# Patient Record
Sex: Female | Born: 1975
Health system: Southern US, Community
[De-identification: ages and names within clinical notes are randomized; demographics above are authoritative.]

## PROBLEM LIST (undated history)

## (undated) DIAGNOSIS — T7840XA Allergy, unspecified, initial encounter: Secondary | ICD-10-CM

## (undated) DIAGNOSIS — F329 Major depressive disorder, single episode, unspecified: Secondary | ICD-10-CM

## (undated) DIAGNOSIS — M5136 Other intervertebral disc degeneration, lumbar region: Secondary | ICD-10-CM

## (undated) DIAGNOSIS — R112 Nausea with vomiting, unspecified: Secondary | ICD-10-CM

## (undated) DIAGNOSIS — Z8614 Personal history of Methicillin resistant Staphylococcus aureus infection: Secondary | ICD-10-CM

## (undated) DIAGNOSIS — T8859XA Other complications of anesthesia, initial encounter: Secondary | ICD-10-CM

## (undated) DIAGNOSIS — F419 Anxiety disorder, unspecified: Secondary | ICD-10-CM

## (undated) DIAGNOSIS — C07 Malignant neoplasm of parotid gland: Secondary | ICD-10-CM

## (undated) DIAGNOSIS — T4145XA Adverse effect of unspecified anesthetic, initial encounter: Secondary | ICD-10-CM

## (undated) DIAGNOSIS — E785 Hyperlipidemia, unspecified: Secondary | ICD-10-CM

## (undated) DIAGNOSIS — F32A Depression, unspecified: Secondary | ICD-10-CM

## (undated) DIAGNOSIS — M5126 Other intervertebral disc displacement, lumbar region: Secondary | ICD-10-CM

## (undated) DIAGNOSIS — I10 Essential (primary) hypertension: Secondary | ICD-10-CM

## (undated) DIAGNOSIS — Z9889 Other specified postprocedural states: Secondary | ICD-10-CM

## (undated) DIAGNOSIS — M51369 Other intervertebral disc degeneration, lumbar region without mention of lumbar back pain or lower extremity pain: Secondary | ICD-10-CM

## (undated) HISTORY — PX: TUBAL LIGATION: SHX77

## (undated) HISTORY — DX: Allergy, unspecified, initial encounter: T78.40XA

## (undated) HISTORY — PX: SPINE SURGERY: SHX786

## (undated) HISTORY — DX: Anxiety disorder, unspecified: F41.9

## (undated) HISTORY — DX: Hyperlipidemia, unspecified: E78.5

---

## 2008-07-17 ENCOUNTER — Emergency Department (HOSPITAL_BASED_OUTPATIENT_CLINIC_OR_DEPARTMENT_OTHER): Admission: EM | Admit: 2008-07-17 | Discharge: 2008-07-17 | Payer: Self-pay | Admitting: Emergency Medicine

## 2012-06-30 HISTORY — PX: MICRODISCECTOMY LUMBAR: SUR864

## 2013-04-12 ENCOUNTER — Other Ambulatory Visit (HOSPITAL_COMMUNITY)
Admission: RE | Admit: 2013-04-12 | Discharge: 2013-04-12 | Disposition: A | Discharge status: Home / Self Care | Payer: BC Managed Care – PPO | Source: Ambulatory Visit | Attending: Otolaryngology | Admitting: Otolaryngology

## 2013-04-12 DIAGNOSIS — R599 Enlarged lymph nodes, unspecified: Secondary | ICD-10-CM | POA: Insufficient documentation

## 2013-04-30 DIAGNOSIS — C07 Malignant neoplasm of parotid gland: Secondary | ICD-10-CM

## 2013-04-30 HISTORY — DX: Malignant neoplasm of parotid gland: C07

## 2013-05-02 ENCOUNTER — Encounter (HOSPITAL_BASED_OUTPATIENT_CLINIC_OR_DEPARTMENT_OTHER): Payer: Self-pay | Admitting: *Deleted

## 2013-05-02 NOTE — Pre-Procedure Instructions (Signed)
To come for BMET; EKG req. from [facility]

## 2013-05-03 ENCOUNTER — Encounter (HOSPITAL_BASED_OUTPATIENT_CLINIC_OR_DEPARTMENT_OTHER)
Admission: RE | Admit: 2013-05-03 | Discharge: 2013-05-03 | Disposition: A | Discharge status: Home / Self Care | Payer: BC Managed Care – PPO | Source: Ambulatory Visit | Attending: Otolaryngology | Admitting: Otolaryngology

## 2013-05-03 DIAGNOSIS — Z01812 Encounter for preprocedural laboratory examination: Secondary | ICD-10-CM | POA: Insufficient documentation

## 2013-05-03 DIAGNOSIS — Z01818 Encounter for other preprocedural examination: Secondary | ICD-10-CM | POA: Insufficient documentation

## 2013-05-03 LAB — BASIC METABOLIC PANEL
Calcium: 9.2 mg/dL (ref 8.4–10.5)
Chloride: 103 mEq/L (ref 96–112)
GFR calc Af Amer: >90 mL/min (ref >90)
GFR calc non Af Amer: >90 mL/min (ref >90)
Glucose, Bld: 79 mg/dL (ref 70–99)
Sodium: 138 mEq/L (ref 135–145)

## 2013-05-06 ENCOUNTER — Other Ambulatory Visit: Payer: Self-pay | Admitting: Otolaryngology

## 2013-05-06 NOTE — H&P (Signed)
[name],  [name] 37 y.o., female [id number]     Chief Complaint: LEft neck mass  HPI: 36-year-old white female noted a lump in her LEFT upper neck maybe one month ago.  It is not tender.  It is not obviously growing.  It does not swell with meals.  No overlying skin changes.  No trismus.  No scalp or face rashes or lesions.  No abnormal or insect bites or scratches.  No other masses elsewhere in her neck or elsewhere in her body.  Her family physician ordered an ultrasound which showed a 1.9 cm mass in the LEFT parotid.  An MRI scan showed the same.  She is here for further evaluation.  Preoperative visit.  We are planning a LEFT superficial parotidectomy with facial nerve preservation.  I discussed the surgeryin detail including risks and complications.  Questions were answered and informed consent was obtained.   I discussed advancement of diet and activity including return to work.  I gave her prescriptions for oxycodone and hydrocodone for postoperative pain relief.  I will call her with the permanent pathology report which will take 3-4 days.  I will see her back 10 days after surgery for suture removal.  PMH: Past Medical History  Diagnosis Date  . Depression   . Hypertension     under control with med., has been on med. x 5 yr.  . 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 History of MRSA infection "years ago"    axilla    Surg Hx: Past Surgical History  Procedure Laterality Date  . Microdiscectomy lumbar  06/2012    L5  . Cesarean section      FHx:  No family history on file. SocHx:  reports that she has quit smoking. She has never used smokeless tobacco. She reports that she does not drink alcohol or use illicit drugs.  ALLERGIES:  Allergies  Allergen Reactions  . Keflex [Cephalexin] Rash  . Sulfa Antibiotics Rash     (Not in a hospital admission)  No results found for this or any previous visit (from the past 48 hour(s)). No results found.  ROS:Systemic: Not feeling  tired (fatigue).  No fever, no night sweats, and no recent weight loss. Head: No headache. Eyes: No eye symptoms. Otolaryngeal: No hearing loss, no earache, no tinnitus, and no purulent nasal discharge.  No nasal passage blockage (stuffiness), no snoring, no sneezing, no hoarseness, and no sore throat. Cardiovascular: No chest pain or discomfort  and no palpitations. Pulmonary: No dyspnea, no cough, and no wheezing. Gastrointestinal: No dysphagia  and no heartburn.  No nausea, no abdominal pain, and no melena.  No diarrhea. Genitourinary: No dysuria. Endocrine: No muscle weakness. Musculoskeletal: No calf muscle cramps, no arthralgias, and no soft tissue swelling. Neurological: No dizziness, no fainting, no tingling, and no numbness. Psychological: Anxiety  and depression. Skin: No rash.  Last menstrual period 04/26/2013.  PHYSICAL EXAM:  BP:128/79,  HR: 103 b/min,  Weight: 225 lb,  BMI Calculated: 36.32 ,   She is slightly heavyset.  She is not distress.  Mental status is sharp.  She hears well in conversational speech.  Voice is clear and respirations unlabored through the nose.  The head is atraumatic and neck supple.  Cranial nerves intact including facial.  Ear canals are clear with normal drums.  Anterior nose is moist and patent.  Oral cavity is clear and moist with teeth in good repair.  Oropharynx is clear.  I did not examine   nasopharynx or hypopharynx.  Neck examination is remarkable for a subtle roughly 2 cm mass behind the angle of the mandible tucked in in front of the mastoid tip.  It is somewhat mobile.  It is not fluctuant.  It is not hard or tender.  No other neck masses or adenopathy.   Facial nerve is intact both sides.  Lungs: Clear to auscultation Heart: Regular rate and rhythm without murmur Abdomen: Soft, active Extremities: Normal configuration Neurologic: Symmetric, grossly intact.  Studies Reviewed:her fine needle aspiration cytology results show mostly  benign salivary cells.  There were a few atypical epitheliod cells  of uncertain diagnostic significance, but not obviously cancer.      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  We are going to do a LEFT superficial parotidectomy for removal of a presumed benign neoplasm.  You should be out of strenuous activities 3 weeks.  Recheck here 10 days after surgery.  I will call you with the final pathology report 3-4 days after surgery.  you will spend one night with us after surgery  Hydrocodone-Acetaminophen 5-325 MG Oral Tablet;1-2 po q4-6h prn pain; Qty40; R0; Rx. Oxycodone-Acetaminophen 5-325 MG Oral Tablet;TAKE 1 TO 2 TABLETS EVERY 4 TO 6 HOURS AS NEEDED FOR PAIN; Qty20; R0; Rx.  [name] [name] 05/06/2013, 4:18 PM     

## 2013-05-09 ENCOUNTER — Encounter (HOSPITAL_BASED_OUTPATIENT_CLINIC_OR_DEPARTMENT_OTHER)
Admission: RE | Disposition: A | Discharge status: Home / Self Care | Payer: Self-pay | Source: Ambulatory Visit | Attending: Otolaryngology

## 2013-05-09 ENCOUNTER — Ambulatory Visit (HOSPITAL_BASED_OUTPATIENT_CLINIC_OR_DEPARTMENT_OTHER): Payer: BC Managed Care – PPO | Admitting: Anesthesiology

## 2013-05-09 ENCOUNTER — Ambulatory Visit (HOSPITAL_BASED_OUTPATIENT_CLINIC_OR_DEPARTMENT_OTHER)
Admission: RE | Admit: 2013-05-09 | Discharge: 2013-05-10 | Disposition: A | Discharge status: Home / Self Care | Payer: BC Managed Care – PPO | Source: Ambulatory Visit | Attending: Otolaryngology | Admitting: Otolaryngology

## 2013-05-09 ENCOUNTER — Other Ambulatory Visit: Payer: Self-pay | Admitting: Otolaryngology

## 2013-05-09 ENCOUNTER — Encounter (HOSPITAL_BASED_OUTPATIENT_CLINIC_OR_DEPARTMENT_OTHER): Payer: Self-pay | Admitting: *Deleted

## 2013-05-09 ENCOUNTER — Encounter (HOSPITAL_BASED_OUTPATIENT_CLINIC_OR_DEPARTMENT_OTHER): Payer: BC Managed Care – PPO | Admitting: Anesthesiology

## 2013-05-09 DIAGNOSIS — Z8614 Personal history of Methicillin resistant Staphylococcus aureus infection: Secondary | ICD-10-CM | POA: Insufficient documentation

## 2013-05-09 DIAGNOSIS — F3289 Other specified depressive episodes: Secondary | ICD-10-CM | POA: Insufficient documentation

## 2013-05-09 DIAGNOSIS — Z882 Allergy status to sulfonamides status: Secondary | ICD-10-CM | POA: Insufficient documentation

## 2013-05-09 DIAGNOSIS — Z881 Allergy status to other antibiotic agents status: Secondary | ICD-10-CM | POA: Insufficient documentation

## 2013-05-09 DIAGNOSIS — K1120 Sialoadenitis, unspecified: Secondary | ICD-10-CM | POA: Insufficient documentation

## 2013-05-09 DIAGNOSIS — I10 Essential (primary) hypertension: Secondary | ICD-10-CM | POA: Insufficient documentation

## 2013-05-09 DIAGNOSIS — C07 Malignant neoplasm of parotid gland: Secondary | ICD-10-CM | POA: Insufficient documentation

## 2013-05-09 DIAGNOSIS — F329 Major depressive disorder, single episode, unspecified: Secondary | ICD-10-CM | POA: Insufficient documentation

## 2013-05-09 HISTORY — DX: Essential (primary) hypertension: I10

## 2013-05-09 HISTORY — PX: PAROTIDECTOMY: SHX2163

## 2013-05-09 HISTORY — DX: Personal history of Methicillin resistant Staphylococcus aureus infection: Z86.14

## 2013-05-09 HISTORY — DX: Major depressive disorder, single episode, unspecified: F32.9

## 2013-05-09 HISTORY — DX: Depression, unspecified: F32.A

## 2013-05-09 SURGERY — EXCISION, PAROTID GLAND
Anesthesia: General | Site: Neck | Laterality: Left | Wound class: Clean

## 2013-05-09 MED ORDER — PROPOFOL 10 MG/ML IV EMUL
INTRAVENOUS | Status: AC
  Filled 2013-05-09: qty 50

## 2013-05-09 MED ORDER — OXYCODONE HCL 5 MG PO TABS
5.0000 mg | ORAL_TABLET | Freq: Once | ORAL | Status: AC | PRN
Start: 2013-05-09 — End: 2013-05-09

## 2013-05-09 MED ORDER — MIDAZOLAM HCL 2 MG/2ML IJ SOLN: 1.0000 mg | INTRAMUSCULAR | Status: DC | PRN

## 2013-05-09 MED ORDER — MORPHINE SULFATE 2 MG/ML IJ SOLN
INTRAMUSCULAR | Status: AC
  Filled 2013-05-09: qty 1

## 2013-05-09 MED ORDER — PROPOFOL 10 MG/ML IV BOLUS
INTRAVENOUS | Status: DC | PRN
  Administered 2013-05-09: 200 mg via INTRAVENOUS

## 2013-05-09 MED ORDER — BACITRACIN ZINC 500 UNIT/GM EX OINT
TOPICAL_OINTMENT | CUTANEOUS | Status: AC
  Filled 2013-05-09: qty 28.35

## 2013-05-09 MED ORDER — VITAMIN D3 25 MCG (1000 UNIT) PO TABS: 1000.0000 [IU] | ORAL_TABLET | Freq: Every day | ORAL | Status: DC

## 2013-05-09 MED ORDER — SUCCINYLCHOLINE CHLORIDE 20 MG/ML IJ SOLN
INTRAMUSCULAR | Status: DC | PRN
  Administered 2013-05-09: 100 mg via INTRAVENOUS

## 2013-05-09 MED ORDER — CITALOPRAM HYDROBROMIDE 20 MG PO TABS: 30.0000 mg | ORAL_TABLET | Freq: Every day | ORAL | Status: DC

## 2013-05-09 MED ORDER — ONDANSETRON HCL 4 MG/2ML IJ SOLN: 4.0000 mg | INTRAMUSCULAR | Status: DC | PRN

## 2013-05-09 MED ORDER — FENTANYL CITRATE 0.05 MG/ML IJ SOLN
INTRAMUSCULAR | Status: AC
  Filled 2013-05-09: qty 4

## 2013-05-09 MED ORDER — HYDROMORPHONE HCL PF 1 MG/ML IJ SOLN
INTRAMUSCULAR | Status: AC
  Filled 2013-05-09: qty 1

## 2013-05-09 MED ORDER — CYCLOBENZAPRINE HCL ER 15 MG PO CP24: 15.0000 mg | ORAL_CAPSULE | Freq: Every day | ORAL | Status: DC | PRN

## 2013-05-09 MED ORDER — HEPARIN SODIUM (PORCINE) 5000 UNIT/ML IJ SOLN
5000.0000 [IU] | Freq: Three times a day (TID) | INTRAMUSCULAR | Status: DC
  Administered 2013-05-09 – 2013-05-10 (×3): 5000 [IU] via SUBCUTANEOUS

## 2013-05-09 MED ORDER — MIDAZOLAM HCL 2 MG/ML PO SYRP: 12.0000 mg | ORAL_SOLUTION | Freq: Once | ORAL | Status: DC | PRN

## 2013-05-09 MED ORDER — CLINDAMYCIN PHOSPHATE 900 MG/50ML IV SOLN
INTRAVENOUS | Status: AC
  Filled 2013-05-09: qty 50

## 2013-05-09 MED ORDER — LACTATED RINGERS IV SOLN
INTRAVENOUS | Status: DC
  Administered 2013-05-09 (×2): via INTRAVENOUS

## 2013-05-09 MED ORDER — CHLORHEXIDINE GLUCONATE 4 % EX LIQD: 1.0000 "application " | Freq: Once | CUTANEOUS | Status: DC

## 2013-05-09 MED ORDER — ONDANSETRON HCL 4 MG/2ML IJ SOLN: 4.0000 mg | Freq: Once | INTRAMUSCULAR | Status: AC | PRN

## 2013-05-09 MED ORDER — LISINOPRIL 20 MG PO TABS: 20.0000 mg | ORAL_TABLET | Freq: Every day | ORAL | Status: DC

## 2013-05-09 MED ORDER — CLINDAMYCIN PHOSPHATE 900 MG/50ML IV SOLN
900.0000 mg | Freq: Once | INTRAVENOUS | Status: AC
  Administered 2013-05-09: 900 mg via INTRAVENOUS

## 2013-05-09 MED ORDER — HYDROCODONE-ACETAMINOPHEN 5-325 MG PO TABS
ORAL_TABLET | ORAL | Status: AC
  Filled 2013-05-09: qty 2

## 2013-05-09 MED ORDER — FENTANYL CITRATE 0.05 MG/ML IJ SOLN
INTRAMUSCULAR | Status: DC | PRN
  Administered 2013-05-09 (×3): 50 ug via INTRAVENOUS
  Administered 2013-05-09: 25 ug via INTRAVENOUS
  Administered 2013-05-09: 100 ug via INTRAVENOUS
  Administered 2013-05-09: 25 ug via INTRAVENOUS

## 2013-05-09 MED ORDER — LIDOCAINE-EPINEPHRINE 1 %-1:100000 IJ SOLN
INTRAMUSCULAR | Status: AC
  Filled 2013-05-09: qty 1

## 2013-05-09 MED ORDER — MIDAZOLAM HCL 2 MG/2ML IJ SOLN
INTRAMUSCULAR | Status: AC
  Filled 2013-05-09: qty 2

## 2013-05-09 MED ORDER — HYDROMORPHONE HCL PF 1 MG/ML IJ SOLN
0.2500 mg | INTRAMUSCULAR | Status: DC | PRN
  Administered 2013-05-09 (×4): 0.5 mg via INTRAVENOUS

## 2013-05-09 MED ORDER — FENTANYL CITRATE 0.05 MG/ML IJ SOLN: 50.0000 ug | INTRAMUSCULAR | Status: DC | PRN

## 2013-05-09 MED ORDER — FENTANYL CITRATE 0.05 MG/ML IJ SOLN
INTRAMUSCULAR | Status: AC
  Filled 2013-05-09: qty 2

## 2013-05-09 MED ORDER — ONDANSETRON HCL 4 MG/2ML IJ SOLN
INTRAMUSCULAR | Status: DC | PRN
  Administered 2013-05-09: 4 mg via INTRAVENOUS

## 2013-05-09 MED ORDER — SUCCINYLCHOLINE CHLORIDE 20 MG/ML IJ SOLN
INTRAMUSCULAR | Status: AC
  Filled 2013-05-09: qty 1

## 2013-05-09 MED ORDER — DEXTROSE-NACL 5-0.45 % IV SOLN
INTRAVENOUS | Status: DC
  Administered 2013-05-09: 100 mL/h via INTRAVENOUS

## 2013-05-09 MED ORDER — HEPARIN SODIUM (PORCINE) 5000 UNIT/ML IJ SOLN
INTRAMUSCULAR | Status: AC
  Filled 2013-05-09: qty 1

## 2013-05-09 MED ORDER — MIDAZOLAM HCL 5 MG/5ML IJ SOLN
INTRAMUSCULAR | Status: DC | PRN
  Administered 2013-05-09: 2 mg via INTRAVENOUS

## 2013-05-09 MED ORDER — LIDOCAINE HCL (CARDIAC) 20 MG/ML IV SOLN
INTRAVENOUS | Status: DC | PRN
  Administered 2013-05-09: 75 mg via INTRAVENOUS

## 2013-05-09 MED ORDER — BACITRACIN ZINC 500 UNIT/GM EX OINT
TOPICAL_OINTMENT | CUTANEOUS | Status: DC | PRN
  Administered 2013-05-09: 1 via TOPICAL

## 2013-05-09 MED ORDER — BACITRACIN ZINC 500 UNIT/GM EX OINT
1.0000 "application " | TOPICAL_OINTMENT | Freq: Three times a day (TID) | CUTANEOUS | Status: DC
  Administered 2013-05-09 – 2013-05-10 (×3): 1 via TOPICAL

## 2013-05-09 MED ORDER — LIDOCAINE-EPINEPHRINE 1 %-1:100000 IJ SOLN
INTRAMUSCULAR | Status: DC | PRN
  Administered 2013-05-09: 7.5 mL

## 2013-05-09 MED ORDER — OXYCODONE HCL 5 MG/5ML PO SOLN: 5.0000 mg | Freq: Once | ORAL | Status: AC | PRN

## 2013-05-09 MED ORDER — ONDANSETRON HCL 4 MG PO TABS: 4.0000 mg | ORAL_TABLET | ORAL | Status: DC | PRN

## 2013-05-09 MED ORDER — EPHEDRINE SULFATE 50 MG/ML IJ SOLN
INTRAMUSCULAR | Status: DC | PRN
  Administered 2013-05-09 (×2): 10 mg via INTRAVENOUS

## 2013-05-09 MED ORDER — MORPHINE SULFATE 2 MG/ML IJ SOLN
1.0000 mg | INTRAMUSCULAR | Status: DC | PRN
  Administered 2013-05-09: 2 mg via INTRAVENOUS

## 2013-05-09 MED ORDER — HYDROCODONE-ACETAMINOPHEN 5-325 MG PO TABS
1.0000 | ORAL_TABLET | ORAL | Status: DC | PRN
  Administered 2013-05-09 (×3): 2 via ORAL
  Administered 2013-05-10: 1 via ORAL
  Administered 2013-05-10: 2 via ORAL

## 2013-05-09 MED ORDER — DEXAMETHASONE SODIUM PHOSPHATE 4 MG/ML IJ SOLN
INTRAMUSCULAR | Status: DC | PRN
  Administered 2013-05-09: 10 mg via INTRAVENOUS

## 2013-05-09 SURGICAL SUPPLY — 77 items
APPLICATOR COTTON TIP 6IN STRL (MISCELLANEOUS) ×2 IMPLANT
ATTRACTOMAT 16X20 MAGNETIC DRP (DRAPES) ×2 IMPLANT
BANDAGE CONFORM 3  STR LF (GAUZE/BANDAGES/DRESSINGS) IMPLANT
BANDAGE GAUZE ELAST BULKY 4 IN (GAUZE/BANDAGES/DRESSINGS) IMPLANT
BENZOIN TINCTURE PRP APPL 2/3 (GAUZE/BANDAGES/DRESSINGS) IMPLANT
BLADE SCALPEL THERMAL 15 (MISCELLANEOUS) ×6 IMPLANT
BLADE SURG 12 STRL SS (BLADE) IMPLANT
BLADE SURG 15 STRL LF DISP TIS (BLADE) ×1 IMPLANT
BLADE SURG 15 STRL SS (BLADE) ×1
CANISTER SUCT 1200ML W/VALVE (MISCELLANEOUS) ×2 IMPLANT
CLEANER CAUTERY TIP 5X5 PAD (MISCELLANEOUS) ×1 IMPLANT
CORDS BIPOLAR (ELECTRODE) ×2 IMPLANT
COVER MAYO STAND STRL (DRAPES) ×2 IMPLANT
COVER TABLE BACK 60X90 (DRAPES) ×2 IMPLANT
DECANTER SPIKE VIAL GLASS SM (MISCELLANEOUS) IMPLANT
DERMABOND ADVANCED (GAUZE/BANDAGES/DRESSINGS)
DERMABOND ADVANCED .7 DNX12 (GAUZE/BANDAGES/DRESSINGS) IMPLANT
DRAIN CHANNEL 7F FF FLAT (WOUND CARE) IMPLANT
DRAIN JACKSON RD 7FR 3/32 (WOUND CARE) IMPLANT
DRAIN JP 10F RND SILICONE (MISCELLANEOUS) ×2 IMPLANT
DRAIN PENROSE 1/4X12 LTX STRL (WOUND CARE) IMPLANT
DRAIN SNY WOU 7FLT (WOUND CARE) IMPLANT
DRAIN TLS ROUND 10FR (DRAIN) IMPLANT
DRAPE INCISE 23X17 IOBAN STRL (DRAPES) ×1
DRAPE INCISE IOBAN 23X17 STRL (DRAPES) ×1 IMPLANT
DRAPE U-SHAPE 76X120 STRL (DRAPES) ×2 IMPLANT
ELECT COATED BLADE 2.86 ST (ELECTRODE) ×2 IMPLANT
ELECT PAIRED SUBDERMAL (MISCELLANEOUS) ×2
ELECT REM PT RETURN 9FT ADLT (ELECTROSURGICAL) ×2
ELECTRODE PAIRED SUBDERMAL (MISCELLANEOUS) ×1 IMPLANT
ELECTRODE REM PT RTRN 9FT ADLT (ELECTROSURGICAL) ×1 IMPLANT
EVACUATOR 3/16  PVC DRAIN (DRAIN)
EVACUATOR 3/16 PVC DRAIN (DRAIN) IMPLANT
EVACUATOR SILICONE 100CC (DRAIN) ×2 IMPLANT
GAUZE SPONGE 4X4 12PLY STRL LF (GAUZE/BANDAGES/DRESSINGS) IMPLANT
GAUZE SPONGE 4X4 16PLY XRAY LF (GAUZE/BANDAGES/DRESSINGS) IMPLANT
GLOVE BIO SURGEON STRL SZ 6.5 (GLOVE) ×4 IMPLANT
GLOVE BIO SURGEON STRL SZ7.5 (GLOVE) IMPLANT
GLOVE BIOGEL PI IND STRL 8 (GLOVE) IMPLANT
GLOVE BIOGEL PI INDICATOR 8 (GLOVE)
GLOVE ECLIPSE 7.5 STRL STRAW (GLOVE) IMPLANT
GLOVE ECLIPSE 8.0 STRL XLNG CF (GLOVE) ×4 IMPLANT
GLOVE EXAM NITRILE EXT CUFF MD (GLOVE) ×2 IMPLANT
GLOVE SS BIOGEL STRL SZ 7.5 (GLOVE) IMPLANT
GLOVE SUPERSENSE BIOGEL SZ 7.5 (GLOVE)
GLOVE SURG SS PI 7.0 STRL IVOR (GLOVE) ×2 IMPLANT
GOWN PREVENTION PLUS XLARGE (GOWN DISPOSABLE) ×4 IMPLANT
GOWN PREVENTION PLUS XXLARGE (GOWN DISPOSABLE) ×2 IMPLANT
LOCATOR NERVE 3 VOLT (DISPOSABLE) ×2 IMPLANT
NEEDLE HYPO 25X1 1.5 SAFETY (NEEDLE) ×2 IMPLANT
NS IRRIG 1000ML POUR BTL (IV SOLUTION) ×2 IMPLANT
PACK BASIN DAY SURGERY FS (CUSTOM PROCEDURE TRAY) ×2 IMPLANT
PAD CLEANER CAUTERY TIP 5X5 (MISCELLANEOUS) ×1
PENCIL BUTTON HOLSTER BLD 10FT (ELECTRODE) ×2 IMPLANT
PIN SAFETY STERILE (MISCELLANEOUS) ×2 IMPLANT
PROBE NERVBE PRASS .33 (MISCELLANEOUS) IMPLANT
SHEET MEDIUM DRAPE 40X70 STRL (DRAPES) IMPLANT
SLEEVE SCD COMPRESS KNEE MED (MISCELLANEOUS) ×2 IMPLANT
STAPLER VISISTAT 35W (STAPLE) ×2 IMPLANT
STRIP CLOSURE SKIN 1/4X4 (GAUZE/BANDAGES/DRESSINGS) IMPLANT
SUCTION FRAZIER TIP 10 FR DISP (SUCTIONS) IMPLANT
SUT CHROMIC 4 0 PS 2 18 (SUTURE) ×4 IMPLANT
SUT ETHILON 3 0 PS 1 (SUTURE) ×2 IMPLANT
SUT ETHILON 5 0 P 3 18 (SUTURE) ×1
SUT NYLON ETHILON 5-0 P-3 1X18 (SUTURE) ×1 IMPLANT
SUT SILK 2 0 FS (SUTURE) ×6 IMPLANT
SUT SILK 2 0 TIES 17X18 (SUTURE)
SUT SILK 2-0 18XBRD TIE BLK (SUTURE) IMPLANT
SUT SILK 3 0 TIES 17X18 (SUTURE) ×1
SUT SILK 3-0 18XBRD TIE BLK (SUTURE) ×1 IMPLANT
SUT SILK 4 0 TIES 17X18 (SUTURE) IMPLANT
SYR BULB 3OZ (MISCELLANEOUS) ×2 IMPLANT
SYR CONTROL 10ML LL (SYRINGE) ×2 IMPLANT
TOWEL OR 17X24 6PK STRL BLUE (TOWEL DISPOSABLE) ×4 IMPLANT
TRAY DSU PREP LF (CUSTOM PROCEDURE TRAY) ×2 IMPLANT
TUBE CONNECTING 20X1/4 (TUBING) ×2 IMPLANT
YANKAUER SUCT BULB TIP NO VENT (SUCTIONS) IMPLANT

## 2013-05-09 NOTE — Anesthesia Preprocedure Evaluation (Addendum)
Anesthesia Evaluation  Patient identified by MRN, date of birth, ID band Patient awake, Patient confused and Patient unresponsive    Reviewed: Allergy & Precautions, H&P , NPO status , Patient's Chart, lab work & pertinent test results  History of Anesthesia Complications Negative for: history of anesthetic complications  Airway Mallampati: I TM Distance: >3 FB Neck ROM: Full    Dental  (+) Teeth Intact and Dental Advisory Given   Pulmonary former smoker,  breath sounds clear to auscultation        Cardiovascular hypertension, Pt. on medications Rhythm:Regular Rate:Normal     Neuro/Psych Depression    GI/Hepatic negative GI ROS, Neg liver ROS,   Endo/Other  negative endocrine ROS  Renal/GU negative Renal ROS     Musculoskeletal negative musculoskeletal ROS (+)   Abdominal   Peds  Hematology negative hematology ROS (+)   Anesthesia Other Findings   Reproductive/Obstetrics negative OB ROS                         Anesthesia Physical Anesthesia Plan  ASA: II  Anesthesia Plan: General   Post-op Pain Management:    Induction: Intravenous  Airway Management Planned: Oral ETT  Additional Equipment:   Intra-op Plan:   Post-operative Plan: Extubation in OR  Informed Consent: I have reviewed the patients History and Physical, chart, labs and discussed the procedure including the risks, benefits and alternatives for the proposed anesthesia with the patient or authorized representative who has indicated his/her understanding and acceptance.   Dental advisory given  Plan Discussed with: Anesthesiologist, CRNA and Surgeon  Anesthesia Plan Comments:         Anesthesia Quick Evaluation

## 2013-05-09 NOTE — Transfer of Care (Signed)
Immediate Anesthesia Transfer of Care Note  Patient: [name] [name]  Procedure(s) Performed: Procedure(s): LEFT SUPERFICIAL PAROTIDECTOMY (Left)  Patient Location: PACU  Anesthesia Type:General  Level of Consciousness: awake, alert  and oriented  Airway & Oxygen Therapy: Patient Spontanous Breathing and Patient connected to face mask oxygen  Post-op Assessment: Report given to PACU RN and Post -op Vital signs reviewed and stable  Post vital signs: Reviewed and stable  Complications: No apparent anesthesia complications

## 2013-05-09 NOTE — Interval H&P Note (Signed)
History and Physical Interval Note:  05/09/2013 7:37 AM  [name] [name]  has presented today for surgery, with the diagnosis of LEFT PAROTID TUMOR  The various methods of treatment have been discussed with the patient and family. After consideration of risks, benefits and other options for treatment, the patient has consented to  Procedure(s): LEFT SUPERFICIAL PAROTIDECTOMY (Left) as a surgical intervention .  The patient's history has been re-reviewed, patient re-examined, no change in status, stable for surgery.  I have re-reviewed the patient's chart and labs.  Questions were answered to the patient's satisfaction.     Flo Shanks

## 2013-05-09 NOTE — Anesthesia Procedure Notes (Signed)
Procedure Name: Intubation Date/Time: 05/09/2013 7:54 AM Performed by: Zenia Resides D Pre-anesthesia Checklist: Patient identified, Emergency Drugs available, Suction available and Patient being monitored Patient Re-evaluated:Patient Re-evaluated prior to inductionOxygen Delivery Method: Circle System Utilized Preoxygenation: Pre-oxygenation with 100% oxygen Intubation Type: IV induction Ventilation: Mask ventilation without difficulty Laryngoscope Size: Mac and 3 Grade View: Grade I Tube type: Oral Number of attempts: 1 Airway Equipment and Method: stylet and oral airway Placement Confirmation: ETT inserted through vocal cords under direct vision,  positive ETCO2 and breath sounds checked- equal and bilateral Secured at: 23 cm Tube secured with: Tape Dental Injury: Teeth and Oropharynx as per pre-operative assessment

## 2013-05-09 NOTE — Anesthesia Postprocedure Evaluation (Signed)
  Anesthesia Post-op Note  Patient: [name] [name]  Procedure(s) Performed: Procedure(s): LEFT SUPERFICIAL PAROTIDECTOMY (Left)  Patient Location: PACU  Anesthesia Type:General  Level of Consciousness: awake, alert  and oriented  Airway and Oxygen Therapy: Patient Spontanous Breathing  Post-op Pain: mild  Post-op Assessment: Post-op Vital signs reviewed  Post-op Vital Signs: Reviewed  Complications: No apparent anesthesia complications

## 2013-05-09 NOTE — Op Note (Signed)
DATE:  10 NOV 14   TIME:  1030    Patient Name:  Jaiya, Mooradian  MRN:  [id number]   Pre-Op Dx: LEFT parotid neoplasm  Post-op Dx: same  Proc: LEFT superficial parotidectomy with facial nerve preservation   Surg:  Flo Shanks T MD  Asst:  Clovis Cao PA  Anes:  GOT  EBL: 25 ml  Comp:   None  Findings:  Firm 2 cm mass, LEFT lower parotid gland.  Several small level II nodes  Procedure:  With the patient in a comfortable supine position, general orotracheal anesthesia was induced without difficulty. At an appropriate level, the table was turned 90, the patient's head rotated to the right, and the patient placed in slight reverse Trendelenburg. The neck was palpated with the findings as described above. The NIMS monitor was installed in the standard fashion.  1% Xylocaine with 1 100,000 epinephrine, 6 mL total was infiltrated into the proposed wound for intraoperative hemostasis. Several minutes were allowed for this to take effect.  A sterile preparation and draping of the left face and neck was accomplished in the standard fashion.   The nerve integrity monitor was tested and observed to be functioning.  A modified Blair incision was executed sharply in the standard fashion and carried into the subcutaneous fat. Standard periparotid flap was generated for going forward. The greater auricular nerve was identified and preserved.  The flap was extended further forward and also backward to encompass the parotid tail. Shaw scalpel was used for dissection and hemostasis.  2-0 silk tie back sutures were placed anteriorly and posteriorly.  Working on a broad front from the sternomastoid muscle up along the external canal, dissection was carried more deeply. Was carried around the anterior aspect of the sternocleidomastoid muscle until the digastric muscle was identified. The cartilage pointer was identified. Several vessels in the preauricular area were controlled with 3-0 silk ligature.  Working farther, facial nerve was identified. Moist sponges were used from this point forward.  The dissection was carried out along the facial nerve towards the pes.  The tumor was felt to be close upon the lower branch. Therefore, the main upper trunk was dissected branch by branch and the gland rolled downward. Small vessels were controlled with bipolar cautery, with Shaw scalpel, or with silk ligature. The tumor was felt to be adequately encompassed by the dissection. Coming off the lowest facial branch, there were adjacent lymph nodes at least one of which was harvested en bloc with the specimen. The specimen was sent off for permanent interpretation.  At this point a small amount of bipolar cautery was used for hemostasis. The wound was rinsed. Valsalva did not produce any bleeding.  Silk retention sutures were removed and the flap laid back down. It appeared viable. A 10 French round drain was placed through the anterior neck skin through a separate puncture. It was secured to the skin with a 4-0 Ethilon stitch. It was laid into the parotid bed.  The flaps were closed with buried 4-0 chromic at the subcutaneous layer, and a running simple 5-0 Ethilon on the skin in a cosmetic fashion. The drain was placed to suction and observed to be working adequately. The patient was cleaned and a small amount of bacitracin ointment was applied along the wound line.  At this point the procedure was completed. The patient was returned to anesthesia, awakened, extubated, and transferred to recovery in stable condition.   Dispo:  PACU to 23 hour overnight observation.  Plan:  Ice, elevation, analgesia, 23 hour suction drainage.  Cephus Richer MD

## 2013-05-09 NOTE — H&P (View-Only) (Signed)
[name], [name] 37 y.o., female [id number]     Chief Complaint: LEft neck mass  HPI: 37 year old white female noted a lump in her LEFT upper neck maybe one month ago.  It is not tender.  It is not obviously growing.  It does not swell with meals.  No overlying skin changes.  No trismus.  No scalp or face rashes or lesions.  No abnormal or insect bites or scratches.  No other masses elsewhere in her neck or elsewhere in her body.  Her family physician ordered an ultrasound which showed a 1.9 cm mass in the LEFT parotid.  An MRI scan showed the same.  She is here for further evaluation.  Preoperative visit.  We are planning a LEFT superficial parotidectomy with facial nerve preservation.  I discussed the surgeryin detail including risks and complications.  Questions were answered and informed consent was obtained.   I discussed advancement of diet and activity including return to work.  I gave her prescriptions for oxycodone and hydrocodone for postoperative pain relief.  I will call her with the permanent pathology report which will take 3-4 days.  I will see her back 10 days after surgery for suture removal.  PMH: Past Medical History  Diagnosis Date  . Depression   . Hypertension     under control with med., has been on med. x 5 yr.  . 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 History of MRSA infection "years ago"    axilla    Surg Hx: Past Surgical History  Procedure Laterality Date  . Microdiscectomy lumbar  06/2012    L5  . Cesarean section      FHx:  No family history on file. SocHx:  reports that she has quit smoking. She has never used smokeless tobacco. She reports that she does not drink alcohol or use illicit drugs.  ALLERGIES:  Allergies  Allergen Reactions  . Keflex [Cephalexin] Rash  . Sulfa Antibiotics Rash     (Not in a hospital admission)  No results found for this or any previous visit (from the past 48 hour(s)). No results found.  WGN:FAOZHYQM: Not feeling  tired (fatigue).  No fever, no night sweats, and no recent weight loss. Head: No headache. Eyes: No eye symptoms. Otolaryngeal: No hearing loss, no earache, no tinnitus, and no purulent nasal discharge.  No nasal passage blockage (stuffiness), no snoring, no sneezing, no hoarseness, and no sore throat. Cardiovascular: No chest pain or discomfort  and no palpitations. Pulmonary: No dyspnea, no cough, and no wheezing. Gastrointestinal: No dysphagia  and no heartburn.  No nausea, no abdominal pain, and no melena.  No diarrhea. Genitourinary: No dysuria. Endocrine: No muscle weakness. Musculoskeletal: No calf muscle cramps, no arthralgias, and no soft tissue swelling. Neurological: No dizziness, no fainting, no tingling, and no numbness. Psychological: Anxiety  and depression. Skin: No rash.  Last menstrual period 04/26/2013.  PHYSICAL EXAM:  BP:128/79,  HR: 103 b/min,  Weight: 225 lb,  BMI Calculated: 36.32 ,   She is slightly heavyset.  She is not distress.  Mental status is sharp.  She hears well in conversational speech.  Voice is clear and respirations unlabored through the nose.  The head is atraumatic and neck supple.  Cranial nerves intact including facial.  Ear canals are clear with normal drums.  Anterior nose is moist and patent.  Oral cavity is clear and moist with teeth in good repair.  Oropharynx is clear.  I did not examine  nasopharynx or hypopharynx.  Neck examination is remarkable for a subtle roughly 2 cm mass behind the angle of the mandible tucked in in front of the mastoid tip.  It is somewhat mobile.  It is not fluctuant.  It is not hard or tender.  No other neck masses or adenopathy.   Facial nerve is intact both sides.  Lungs: Clear to auscultation Heart: Regular rate and rhythm without murmur Abdomen: Soft, active Extremities: Normal configuration Neurologic: Symmetric, grossly intact.  Studies Reviewed:her fine needle aspiration cytology results show mostly  benign salivary cells.  There were a few atypical epitheliod cells  of uncertain diagnostic significance, but not obviously cancer.      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  We are going to do a LEFT superficial parotidectomy for removal of a presumed benign neoplasm.  You should be out of strenuous activities 3 weeks.  Recheck here 10 days after surgery.  I will call you with the final pathology report 3-4 days after surgery.  you will spend one night with [place] after surgery  Hydrocodone-Acetaminophen 5-325 MG Oral Tablet;1-2 po q4-6h prn pain; Qty40; R0; Rx. Oxycodone-Acetaminophen 5-325 MG Oral Tablet;TAKE 1 TO 2 TABLETS EVERY 4 TO 6 HOURS AS NEEDED FOR PAIN; Qty20; R0; Rx.  [name] [name], [name] [name] 05/06/2013, 4:18 PM

## 2013-05-10 ENCOUNTER — Encounter (HOSPITAL_BASED_OUTPATIENT_CLINIC_OR_DEPARTMENT_OTHER): Payer: Self-pay | Admitting: Otolaryngology

## 2013-05-10 MED ORDER — HEPARIN SODIUM (PORCINE) 5000 UNIT/ML IJ SOLN
INTRAMUSCULAR | Status: AC
  Filled 2013-05-10: qty 1

## 2013-05-10 MED ORDER — HYDROCODONE-ACETAMINOPHEN 5-325 MG PO TABS
ORAL_TABLET | ORAL | Status: AC
  Filled 2013-05-10: qty 2

## 2013-05-10 MED ORDER — HYDROCODONE-ACETAMINOPHEN 5-325 MG PO TABS
ORAL_TABLET | ORAL | Status: AC
  Filled 2013-05-10: qty 1

## 2013-05-10 NOTE — Discharge Summary (Signed)
05/10/2013 8:45 AM  [name] [name] [id number]  Post-Op Day 1  Discharge Note    Temp:  [97.7 F (36.5 C)-99.4 F (37.4 C)] 98.5 F (36.9 C) (11/11 0500) Pulse Rate:  [63-120] 63 (11/11 0500) Resp:  [13-29] 16 (11/11 0500) BP: (99-113)/(47-71) 106/69 mmHg (11/11 0500) SpO2:  [92 %-100 %] 99 % (11/11 0500),     Intake/Output Summary (Last 24 hours) at 05/10/13 0845 Last data filed at 05/10/13 0800  Gross per 24 hour  Intake   3254 ml  Output 2237.5 ml  Net 1016.5 ml    No results found for this or any previous visit (from the past 24 hour(s)).  SUBJECTIVE:  Mod pain.  Some N,V last evening.  Able to void after I/O cath.  Ear and facial flap numb.  Breathing OK.  Swallowing OK  OBJECTIVE:  Mild pan facial weakness, worse than in PACU yest.  Mod ramus mandibularis weakness.  Flap flat.  Sutures clean.    IMPRESSION:  Satisfactory check  PLAN:  Wound hygiene.  Elevation.  Analgesia.  Advance diet and activity.  Will call with path report.  Recheck my office 8 days for suture removal.  Admit:  10 NOV Discharge:  11 NOV Final Diagnosis:  LEFT parotid neoplasm Proc: LEFT superficial parotidectomy Comp: none Cond:  Ambulatory, pain controlled.  Drain out.  Eating, breathing well.   Voiding R/v:  8 days Rx:  Oxycodone Instructions written and given  Hosp Course:  Underwent superficial parotidectomy with facial n preservation.  Required I/O cath x 1.  Sl N, V.  Min drainage.  Drain d/c'd AM POD 1.  Discharged to home and care of family.  Flo Shanks

## 2013-05-12 ENCOUNTER — Encounter (HOSPITAL_BASED_OUTPATIENT_CLINIC_OR_DEPARTMENT_OTHER): Payer: Self-pay | Admitting: *Deleted

## 2013-05-12 ENCOUNTER — Other Ambulatory Visit (HOSPITAL_COMMUNITY): Payer: Self-pay | Admitting: Otolaryngology

## 2013-05-12 NOTE — H&P (Signed)
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  She feels like she has some new swelling in her midline neck.  She has been sleeping elevated.   Since the surgery, she has numbness of the LEFT ear and face, and some sluggish activation of her blink and her LEFT lower lip.     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  According to the pathologist,  the tumor was not encapsulated.  They could not orient the specimen as to where the positive margin was.  The preoperative needle aspiration did not suggest malignancy.  Clinical examination likewise.  At the time of surgery, there was firmness in the parotid tail which I avoided, but I did not see actual tumor at any point.  Dissection went from the inferior most branch of the upper branch of the pes downward.    There was good clean-out at the level of the sternomastoid muscle, the digastric muscle, and down to the masseter muscle inferiorly.  The specimen was not oriented as there was minimal suspicion.  PMH: Past Medical History  Diagnosis Date  . Depression   . Hypertension     under control with med., has been on med. x 5 yr.  . History of MRSA infection "years ago"    axilla  . 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 Microdiscectomy lumbar  06/2012    L5  . Cesarean section    . Parotidectomy Left 05/09/2013    Procedure: LEFT SUPERFICIAL PAROTIDECTOMY;  Surgeon: Yojan Paskett, MD;  Location: [facility];  Service: ENT;  Laterality: Left;    FHx:  No family history on file. SocHx:  reports that she has quit smoking. She has never used smokeless tobacco. She reports that she does not drink alcohol or use illicit drugs.  ALLERGIES:  Allergies  Allergen Reactions  . Keflex [Cephalexin] Rash   . Sulfa Antibiotics Rash     (Not in a hospital admission)  No results found for this or any previous visit (from the past 48 hour(s)). No results found.    Last menstrual period 04/26/2013.  PHYSICAL EXAM: She does not appear distressed.  The wound is flat and intact.  She has very mild  weakness of the upper facial branches.  Moderate weakness of the ramus mandibularis branch.  Mild facial and neck edema.  No neck masses.   Lungs: Clear to auscultation Heart: Regular rate and rhythm without murmur Abdomen: Obese, soft, active Extremities: Normal configuration Neurologic: Grossly intact with the exception of the facial nerve.  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  the tumor was unencapsulated.  The pathologist was not able to orient the positive margin to help be decided this was a surgical artifact, or a true positive margin.    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  Given the uncertainty about whether there is any residual tumor in your body, I think we are obligated to do a completion parotidectomy in the near future.  Once again, you will stay overnight one night in the hospital.  Return to work 3 weeks after the surgery.  Pain medicine as required.  Recheck here 8 days after surgery for suture removal.  We will   discuss your case at tumor board,  but likely we will not need any additional therapy, nor any appointments with medical oncology or radiation oncology.  We will watch you  closely into the future.  She has leftover narcotic analgesics from previous procedure at home.  [name] [name] [name] 05/12/2013, 7:02 PM     

## 2013-05-16 ENCOUNTER — Ambulatory Visit (HOSPITAL_BASED_OUTPATIENT_CLINIC_OR_DEPARTMENT_OTHER)
Admission: RE | Admit: 2013-05-16 | Discharge: 2013-05-17 | Disposition: A | Discharge status: Home / Self Care | Payer: BC Managed Care – PPO | Source: Ambulatory Visit | Attending: Otolaryngology | Admitting: Otolaryngology

## 2013-05-16 ENCOUNTER — Encounter (HOSPITAL_BASED_OUTPATIENT_CLINIC_OR_DEPARTMENT_OTHER): Payer: BC Managed Care – PPO | Admitting: Anesthesiology

## 2013-05-16 ENCOUNTER — Encounter (HOSPITAL_BASED_OUTPATIENT_CLINIC_OR_DEPARTMENT_OTHER)
Admission: RE | Disposition: A | Discharge status: Home / Self Care | Payer: Self-pay | Source: Ambulatory Visit | Attending: Otolaryngology

## 2013-05-16 ENCOUNTER — Ambulatory Visit (HOSPITAL_BASED_OUTPATIENT_CLINIC_OR_DEPARTMENT_OTHER): Payer: BC Managed Care – PPO | Admitting: Anesthesiology

## 2013-05-16 ENCOUNTER — Encounter (HOSPITAL_BASED_OUTPATIENT_CLINIC_OR_DEPARTMENT_OTHER): Payer: Self-pay

## 2013-05-16 DIAGNOSIS — Z87891 Personal history of nicotine dependence: Secondary | ICD-10-CM | POA: Insufficient documentation

## 2013-05-16 DIAGNOSIS — C07 Malignant neoplasm of parotid gland: Secondary | ICD-10-CM | POA: Insufficient documentation

## 2013-05-16 DIAGNOSIS — I10 Essential (primary) hypertension: Secondary | ICD-10-CM | POA: Insufficient documentation

## 2013-05-16 HISTORY — PX: PAROTIDECTOMY: SHX2163

## 2013-05-16 HISTORY — DX: Malignant neoplasm of parotid gland: C07

## 2013-05-16 SURGERY — EXCISION, PAROTID GLAND
Anesthesia: General | Site: Face | Laterality: Left | Wound class: Clean

## 2013-05-16 MED ORDER — SUCCINYLCHOLINE CHLORIDE 20 MG/ML IJ SOLN
INTRAMUSCULAR | Status: DC | PRN
  Administered 2013-05-16: 100 mg via INTRAVENOUS

## 2013-05-16 MED ORDER — HYDROMORPHONE HCL PF 1 MG/ML IJ SOLN
0.2500 mg | INTRAMUSCULAR | Status: DC | PRN
  Administered 2013-05-16 (×4): 0.5 mg via INTRAVENOUS

## 2013-05-16 MED ORDER — MIDAZOLAM HCL 2 MG/2ML IJ SOLN
INTRAMUSCULAR | Status: AC
  Filled 2013-05-16: qty 2

## 2013-05-16 MED ORDER — CLINDAMYCIN PHOSPHATE 900 MG/50ML IV SOLN
900.0000 mg | Freq: Once | INTRAVENOUS | Status: AC
  Administered 2013-05-16 (×2): 900 mg via INTRAVENOUS

## 2013-05-16 MED ORDER — OXYCODONE-ACETAMINOPHEN 5-325 MG PO TABS
ORAL_TABLET | ORAL | Status: AC
  Filled 2013-05-16: qty 2

## 2013-05-16 MED ORDER — MORPHINE SULFATE 2 MG/ML IJ SOLN
INTRAMUSCULAR | Status: AC
  Filled 2013-05-16: qty 1

## 2013-05-16 MED ORDER — HYDROMORPHONE HCL PF 1 MG/ML IJ SOLN
0.5000 mg | Freq: Once | INTRAMUSCULAR | Status: AC | PRN
  Administered 2013-05-16: 0.5 mg via INTRAVENOUS

## 2013-05-16 MED ORDER — LISINOPRIL 20 MG PO TABS: 20.0000 mg | ORAL_TABLET | Freq: Every day | ORAL | Status: DC

## 2013-05-16 MED ORDER — DEXTROSE-NACL 5-0.45 % IV SOLN
INTRAVENOUS | Status: DC
  Administered 2013-05-16: 125 mL/h via INTRAVENOUS

## 2013-05-16 MED ORDER — LACTATED RINGERS IV SOLN
INTRAVENOUS | Status: DC
  Administered 2013-05-16 (×2): via INTRAVENOUS

## 2013-05-16 MED ORDER — CHLORHEXIDINE GLUCONATE 4 % EX LIQD: 1.0000 "application " | Freq: Once | CUTANEOUS | Status: DC

## 2013-05-16 MED ORDER — PROPOFOL 10 MG/ML IV BOLUS
INTRAVENOUS | Status: DC | PRN
  Administered 2013-05-16: 200 mg via INTRAVENOUS

## 2013-05-16 MED ORDER — CITALOPRAM HYDROBROMIDE 10 MG PO TABS: 30.0000 mg | ORAL_TABLET | Freq: Every day | ORAL | Status: DC

## 2013-05-16 MED ORDER — HYDROMORPHONE HCL PF 1 MG/ML IJ SOLN
INTRAMUSCULAR | Status: AC
  Filled 2013-05-16: qty 1

## 2013-05-16 MED ORDER — ONDANSETRON HCL 4 MG/2ML IJ SOLN
INTRAMUSCULAR | Status: DC | PRN
  Administered 2013-05-16: 4 mg via INTRAVENOUS

## 2013-05-16 MED ORDER — PROPOFOL 10 MG/ML IV EMUL
INTRAVENOUS | Status: AC
  Filled 2013-05-16: qty 50

## 2013-05-16 MED ORDER — LIDOCAINE-EPINEPHRINE 1 %-1:100000 IJ SOLN
INTRAMUSCULAR | Status: AC
  Filled 2013-05-16: qty 1

## 2013-05-16 MED ORDER — MIDAZOLAM HCL 2 MG/ML PO SYRP: 12.0000 mg | ORAL_SOLUTION | Freq: Once | ORAL | Status: DC | PRN

## 2013-05-16 MED ORDER — MIDAZOLAM HCL 2 MG/2ML IJ SOLN: 1.0000 mg | INTRAMUSCULAR | Status: DC | PRN

## 2013-05-16 MED ORDER — ONDANSETRON HCL 4 MG/2ML IJ SOLN: 4.0000 mg | Freq: Once | INTRAMUSCULAR | Status: AC | PRN

## 2013-05-16 MED ORDER — MORPHINE SULFATE 2 MG/ML IJ SOLN
1.0000 mg | INTRAMUSCULAR | Status: DC | PRN
  Administered 2013-05-16 (×3): 2 mg via INTRAVENOUS

## 2013-05-16 MED ORDER — PROMETHAZINE HCL 25 MG PO TABS
25.0000 mg | ORAL_TABLET | Freq: Three times a day (TID) | ORAL | Status: DC | PRN
  Administered 2013-05-16: 25 mg via ORAL

## 2013-05-16 MED ORDER — OXYCODONE HCL 5 MG PO TABS: 5.0000 mg | ORAL_TABLET | Freq: Once | ORAL | Status: AC | PRN

## 2013-05-16 MED ORDER — FENTANYL CITRATE 0.05 MG/ML IJ SOLN
INTRAMUSCULAR | Status: DC | PRN
  Administered 2013-05-16: 100 ug via INTRAVENOUS
  Administered 2013-05-16: 50 ug via INTRAVENOUS
  Administered 2013-05-16: 25 ug via INTRAVENOUS
  Administered 2013-05-16: 50 ug via INTRAVENOUS
  Administered 2013-05-16: 100 ug via INTRAVENOUS
  Administered 2013-05-16: 25 ug via INTRAVENOUS
  Administered 2013-05-16: 50 ug via INTRAVENOUS

## 2013-05-16 MED ORDER — OXYCODONE HCL 5 MG/5ML PO SOLN: 5.0000 mg | Freq: Once | ORAL | Status: AC | PRN

## 2013-05-16 MED ORDER — PROMETHAZINE HCL 25 MG PO TABS
ORAL_TABLET | ORAL | Status: AC
  Filled 2013-05-16: qty 1

## 2013-05-16 MED ORDER — BACITRACIN ZINC 500 UNIT/GM EX OINT
TOPICAL_OINTMENT | CUTANEOUS | Status: AC
  Filled 2013-05-16: qty 9

## 2013-05-16 MED ORDER — EPHEDRINE SULFATE 50 MG/ML IJ SOLN
INTRAMUSCULAR | Status: DC | PRN
  Administered 2013-05-16 (×2): 10 mg via INTRAVENOUS

## 2013-05-16 MED ORDER — FENTANYL CITRATE 0.05 MG/ML IJ SOLN
INTRAMUSCULAR | Status: AC
  Filled 2013-05-16: qty 10

## 2013-05-16 MED ORDER — MIDAZOLAM HCL 5 MG/5ML IJ SOLN
INTRAMUSCULAR | Status: DC | PRN
  Administered 2013-05-16: 2 mg via INTRAVENOUS

## 2013-05-16 MED ORDER — PROMETHAZINE HCL 25 MG RE SUPP: 25.0000 mg | Freq: Four times a day (QID) | RECTAL | Status: DC | PRN

## 2013-05-16 MED ORDER — MEPERIDINE HCL 25 MG/ML IJ SOLN: 6.2500 mg | INTRAMUSCULAR | Status: DC | PRN

## 2013-05-16 MED ORDER — HEPARIN SODIUM (PORCINE) 5000 UNIT/ML IJ SOLN: 5000.0000 [IU] | Freq: Three times a day (TID) | INTRAMUSCULAR | Status: DC

## 2013-05-16 MED ORDER — LIDOCAINE HCL (CARDIAC) 20 MG/ML IV SOLN
INTRAVENOUS | Status: DC | PRN
  Administered 2013-05-16: 75 mg via INTRAVENOUS

## 2013-05-16 MED ORDER — ONDANSETRON HCL 4 MG/2ML IJ SOLN: INTRAMUSCULAR | Status: DC | PRN

## 2013-05-16 MED ORDER — BACITRACIN ZINC 500 UNIT/GM EX OINT
1.0000 "application " | TOPICAL_OINTMENT | Freq: Three times a day (TID) | CUTANEOUS | Status: DC
  Administered 2013-05-17: 1 via TOPICAL

## 2013-05-16 MED ORDER — CLINDAMYCIN PHOSPHATE 900 MG/50ML IV SOLN
INTRAVENOUS | Status: AC
  Filled 2013-05-16: qty 50

## 2013-05-16 MED ORDER — OXYCODONE-ACETAMINOPHEN 5-325 MG PO TABS
1.0000 | ORAL_TABLET | ORAL | Status: DC | PRN
  Administered 2013-05-16 (×3): 2 via ORAL
  Administered 2013-05-17: 1 via ORAL
  Administered 2013-05-17: 2 via ORAL

## 2013-05-16 MED ORDER — CYCLOBENZAPRINE HCL ER 15 MG PO CP24: 15.0000 mg | ORAL_CAPSULE | Freq: Every day | ORAL | Status: DC | PRN

## 2013-05-16 MED ORDER — LIDOCAINE-EPINEPHRINE 1 %-1:100000 IJ SOLN
INTRAMUSCULAR | Status: DC | PRN
  Administered 2013-05-16: 7 mL

## 2013-05-16 MED ORDER — FENTANYL CITRATE 0.05 MG/ML IJ SOLN: 50.0000 ug | INTRAMUSCULAR | Status: DC | PRN

## 2013-05-16 SURGICAL SUPPLY — 81 items
APPLICATOR COTTON TIP 6IN STRL (MISCELLANEOUS) ×2 IMPLANT
ATTRACTOMAT 16X20 MAGNETIC DRP (DRAPES) ×2 IMPLANT
BANDAGE CONFORM 3  STR LF (GAUZE/BANDAGES/DRESSINGS) IMPLANT
BANDAGE GAUZE ELAST BULKY 4 IN (GAUZE/BANDAGES/DRESSINGS) IMPLANT
BENZOIN TINCTURE PRP APPL 2/3 (GAUZE/BANDAGES/DRESSINGS) IMPLANT
BLADE SCALPEL THERMAL 15 (MISCELLANEOUS) ×6 IMPLANT
BLADE SURG 12 STRL SS (BLADE) IMPLANT
BLADE SURG 15 STRL LF DISP TIS (BLADE) ×1 IMPLANT
BLADE SURG 15 STRL SS (BLADE) ×1
CANISTER SUCT 1200ML W/VALVE (MISCELLANEOUS) ×2 IMPLANT
CATH ROBINSON RED A/P 14FR (CATHETERS) ×2 IMPLANT
CLEANER CAUTERY TIP 5X5 PAD (MISCELLANEOUS) ×1 IMPLANT
CORDS BIPOLAR (ELECTRODE) ×2 IMPLANT
COVER MAYO STAND STRL (DRAPES) ×2 IMPLANT
COVER TABLE BACK 60X90 (DRAPES) ×2 IMPLANT
DECANTER SPIKE VIAL GLASS SM (MISCELLANEOUS) ×2 IMPLANT
DERMABOND ADVANCED (GAUZE/BANDAGES/DRESSINGS)
DERMABOND ADVANCED .7 DNX12 (GAUZE/BANDAGES/DRESSINGS) IMPLANT
DRAIN CHANNEL 7F FF FLAT (WOUND CARE) IMPLANT
DRAIN JACKSON RD 7FR 3/32 (WOUND CARE) IMPLANT
DRAIN JP 10F RND SILICONE (MISCELLANEOUS) ×2 IMPLANT
DRAIN PENROSE 1/4X12 LTX STRL (WOUND CARE) IMPLANT
DRAIN SNY WOU 7FLT (WOUND CARE) IMPLANT
DRAIN TLS ROUND 10FR (DRAIN) IMPLANT
DRAPE INCISE 23X17 IOBAN STRL (DRAPES) ×1
DRAPE INCISE IOBAN 23X17 STRL (DRAPES) ×1 IMPLANT
DRAPE U-SHAPE 76X120 STRL (DRAPES) ×2 IMPLANT
ELECT COATED BLADE 2.86 ST (ELECTRODE) ×2 IMPLANT
ELECT PAIRED SUBDERMAL (MISCELLANEOUS) ×2
ELECT REM PT RETURN 9FT ADLT (ELECTROSURGICAL) ×2
ELECTRODE PAIRED SUBDERMAL (MISCELLANEOUS) ×1 IMPLANT
ELECTRODE REM PT RTRN 9FT ADLT (ELECTROSURGICAL) ×1 IMPLANT
EVACUATOR 3/16  PVC DRAIN (DRAIN)
EVACUATOR 3/16 PVC DRAIN (DRAIN) IMPLANT
EVACUATOR SILICONE 100CC (DRAIN) ×2 IMPLANT
GAUZE SPONGE 4X4 12PLY STRL LF (GAUZE/BANDAGES/DRESSINGS) IMPLANT
GAUZE SPONGE 4X4 16PLY XRAY LF (GAUZE/BANDAGES/DRESSINGS) ×2 IMPLANT
GLOVE BIO SURGEON STRL SZ 6.5 (GLOVE) IMPLANT
GLOVE BIO SURGEON STRL SZ7.5 (GLOVE) IMPLANT
GLOVE BIOGEL PI IND STRL 7.0 (GLOVE) ×1 IMPLANT
GLOVE BIOGEL PI IND STRL 8 (GLOVE) IMPLANT
GLOVE BIOGEL PI INDICATOR 7.0 (GLOVE) ×1
GLOVE BIOGEL PI INDICATOR 8 (GLOVE)
GLOVE ECLIPSE 6.5 STRL STRAW (GLOVE) ×2 IMPLANT
GLOVE ECLIPSE 7.5 STRL STRAW (GLOVE) ×2 IMPLANT
GLOVE ECLIPSE 8.0 STRL XLNG CF (GLOVE) ×2 IMPLANT
GLOVE EXAM NITRILE MD LF STRL (GLOVE) ×2 IMPLANT
GLOVE SS BIOGEL STRL SZ 7.5 (GLOVE) IMPLANT
GLOVE SUPERSENSE BIOGEL SZ 7.5 (GLOVE)
GOWN PREVENTION PLUS XLARGE (GOWN DISPOSABLE) ×2 IMPLANT
GOWN PREVENTION PLUS XXLARGE (GOWN DISPOSABLE) ×4 IMPLANT
LOCATOR NERVE 3 VOLT (DISPOSABLE) IMPLANT
NEEDLE HYPO 25X1 1.5 SAFETY (NEEDLE) ×2 IMPLANT
NS IRRIG 1000ML POUR BTL (IV SOLUTION) ×2 IMPLANT
PACK BASIN DAY SURGERY FS (CUSTOM PROCEDURE TRAY) ×2 IMPLANT
PAD CLEANER CAUTERY TIP 5X5 (MISCELLANEOUS) ×1
PENCIL BUTTON HOLSTER BLD 10FT (ELECTRODE) ×2 IMPLANT
PIN SAFETY STERILE (MISCELLANEOUS) IMPLANT
PROBE NERVBE PRASS .33 (MISCELLANEOUS) IMPLANT
SHEET MEDIUM DRAPE 40X70 STRL (DRAPES) IMPLANT
SLEEVE SCD COMPRESS KNEE MED (MISCELLANEOUS) ×2 IMPLANT
STAPLER VISISTAT 35W (STAPLE) ×2 IMPLANT
STRIP CLOSURE SKIN 1/4X4 (GAUZE/BANDAGES/DRESSINGS) IMPLANT
SUCTION FRAZIER TIP 10 FR DISP (SUCTIONS) IMPLANT
SUT CHROMIC 4 0 PS 2 18 (SUTURE) ×4 IMPLANT
SUT ETHILON 3 0 PS 1 (SUTURE) ×2 IMPLANT
SUT ETHILON 5 0 P 3 18 (SUTURE)
SUT ETHILON 5 0 PS 2 18 (SUTURE) ×2 IMPLANT
SUT NYLON ETHILON 5-0 P-3 1X18 (SUTURE) IMPLANT
SUT SILK 2 0 FS (SUTURE) ×4 IMPLANT
SUT SILK 2 0 TIES 17X18 (SUTURE) ×1
SUT SILK 2-0 18XBRD TIE BLK (SUTURE) ×1 IMPLANT
SUT SILK 3 0 TIES 17X18 (SUTURE) ×1
SUT SILK 3-0 18XBRD TIE BLK (SUTURE) ×1 IMPLANT
SUT SILK 4 0 TIES 17X18 (SUTURE) IMPLANT
SYR BULB 3OZ (MISCELLANEOUS) ×2 IMPLANT
SYR CONTROL 10ML LL (SYRINGE) ×2 IMPLANT
TOWEL OR 17X24 6PK STRL BLUE (TOWEL DISPOSABLE) ×4 IMPLANT
TRAY DSU PREP LF (CUSTOM PROCEDURE TRAY) ×2 IMPLANT
TUBE CONNECTING 20X1/4 (TUBING) ×2 IMPLANT
YANKAUER SUCT BULB TIP NO VENT (SUCTIONS) IMPLANT

## 2013-05-16 NOTE — Progress Notes (Signed)
   ENT Progress Note: s/p Procedure(s): COMPLETION PAROTIDECTOMY LEFT WITH FACIAL  NERVE PRESERVATION    Subjective: Pt stable, c/o Rt otalgia  Objective: Vital signs in last 24 hours: Temp:  [97.1 F (36.2 C)-99 F (37.2 C)] 99 F (37.2 C) (11/17 1530) Pulse Rate:  [74-114] 89 (11/17 1530) Resp:  [14-20] 16 (11/17 1530) BP: (105-139)/(68-86) 105/68 mmHg (11/17 1530) SpO2:  [95 %-100 %] 95 % (11/17 1700) Weight:  [103.42 kg (228 lb)] 103.42 kg (228 lb) (11/17 1610) Weight change:     Intake/Output from previous day:   Intake/Output this shift: Total I/O In: 2274 [P.O.:474; I.V.:1800] Out: 880 [Urine:720; Drains:10; Blood:150]  Labs: No results found for this basename: WBC, HGB, HCT, PLT,  in the last 72 hours No results found for this basename: NA, K, CL, CO2, GLUCOSE, BUN, CREATININR, CALCIUM,  in the last 72 hours  Studies/Results: No results found.   PHYSICAL EXAM: Inc intact, no erythema or swelling Min JP output Mild FN weakness   Assessment/Plan: Stable Postop Monitor JP output and pain management Plan d/c in am    Manjot Beumer 05/16/2013, 5:36 PM

## 2013-05-16 NOTE — Anesthesia Procedure Notes (Signed)
Procedure Name: Intubation Date/Time: 05/16/2013 7:53 AM Performed by: Zenia Resides D Pre-anesthesia Checklist: Patient identified, Emergency Drugs available, Suction available and Patient being monitored Patient Re-evaluated:Patient Re-evaluated prior to inductionOxygen Delivery Method: Circle System Utilized Preoxygenation: Pre-oxygenation with 100% oxygen Intubation Type: IV induction Ventilation: Mask ventilation without difficulty Laryngoscope Size: Mac and 3 Grade View: Grade I Tube type: Oral Number of attempts: 1 Airway Equipment and Method: stylet and oral airway Placement Confirmation: ETT inserted through vocal cords under direct vision,  positive ETCO2 and breath sounds checked- equal and bilateral Secured at: 23 cm Tube secured with: Tape Dental Injury: Teeth and Oropharynx as per pre-operative assessment

## 2013-05-16 NOTE — Interval H&P Note (Signed)
History and Physical Interval Note:  05/16/2013 7:39 AM  [name] [name]  has presented today for surgery, with the diagnosis of left parotid cancer   The various methods of treatment have been discussed with the patient and family. After consideration of risks, benefits and other options for treatment, the patient has consented to  Procedure(s): COMPLETION PAROTIDECTOMY LEFT WITH FACIAL  NERVE PRESERVATION  (Left) as a surgical intervention .  The patient's history has been re-reviewed, patient re-examined, no change in status, stable for surgery.  I have re-reviewed the patient's chart and labs.  Questions were answered to the patient's satisfaction.     Flo Shanks

## 2013-05-16 NOTE — OR Nursing (Signed)
In and out catheter 14 fr. 20 cc clear urine obtained.

## 2013-05-16 NOTE — Transfer of Care (Signed)
Immediate Anesthesia Transfer of Care Note  Patient: [name] [name]  Procedure(s) Performed: Procedure(s): COMPLETION PAROTIDECTOMY LEFT WITH FACIAL  NERVE PRESERVATION  (Left)  Patient Location: PACU  Anesthesia Type:General  Level of Consciousness: awake, alert  and oriented  Airway & Oxygen Therapy: Patient Spontanous Breathing and Patient connected to face mask oxygen  Post-op Assessment: Report given to PACU RN and Post -op Vital signs reviewed and stable  Post vital signs: Reviewed and stable  Complications: No apparent anesthesia complications

## 2013-05-16 NOTE — H&P (View-Only) (Signed)
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  She feels like she has some new swelling in her midline neck.  She has been sleeping elevated.   Since the surgery, she has numbness of the LEFT ear and face, and some sluggish activation of her blink and her LEFT lower lip.     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  According to the pathologist,  the tumor was not encapsulated.  They could not orient the specimen as to where the positive margin was.  The preoperative needle aspiration did not suggest malignancy.  Clinical examination likewise.  At the time of surgery, there was firmness in the parotid tail which I avoided, but I did not see actual tumor at any point.  Dissection went from the inferior most branch of the upper branch of the pes downward.    There was good clean-out at the level of the sternomastoid muscle, the digastric muscle, and down to the masseter muscle inferiorly.  The specimen was not oriented as there was minimal suspicion.  PMH: Past Medical History  Diagnosis Date  . Depression   . Hypertension     under control with med., has been on med. x 5 yr.  . History of MRSA infection "years ago"    axilla  . 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 Microdiscectomy lumbar  06/2012    L5  . Cesarean section    . Parotidectomy Left 05/09/2013    Procedure: LEFT SUPERFICIAL PAROTIDECTOMY;  Surgeon: Flo Shanks, MD;  Location: [facility];  Service: ENT;  Laterality: Left;    FHx:  No family history on file. SocHx:  reports that she has quit smoking. She has never used smokeless tobacco. She reports that she does not drink alcohol or use illicit drugs.  ALLERGIES:  Allergies  Allergen Reactions  . Keflex [Cephalexin] Rash   . Sulfa Antibiotics Rash     (Not in a hospital admission)  No results found for this or any previous visit (from the past 48 hour(s)). No results found.    Last menstrual period 04/26/2013.  PHYSICAL EXAM: She does not appear distressed.  The wound is flat and intact.  She has very mild  weakness of the upper facial branches.  Moderate weakness of the ramus mandibularis branch.  Mild facial and neck edema.  No neck masses.   Lungs: Clear to auscultation Heart: Regular rate and rhythm without murmur Abdomen: Obese, soft, active Extremities: Normal configuration Neurologic: Grossly intact with the exception of the facial nerve.  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  the tumor was unencapsulated.  The pathologist was not able to orient the positive margin to help be decided this was a surgical artifact, or a true positive margin.    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  Given the uncertainty about whether there is any residual tumor in your body, I think we are obligated to do a completion parotidectomy in the near future.  Once again, you will stay overnight one night in the hospital.  Return to work 3 weeks after the surgery.  Pain medicine as required.  Recheck here 8 days after surgery for suture removal.  We will  discuss your case at tumor board,  but likely we will not need any additional therapy, nor any appointments with medical oncology or radiation oncology.  We will watch you  closely into the future.  She has leftover narcotic analgesics from previous procedure at home.  Flo Shanks 05/12/2013, 7:02 PM

## 2013-05-16 NOTE — Anesthesia Preprocedure Evaluation (Signed)
Anesthesia Evaluation    Airway Mallampati: I TM Distance: >3 FB Neck ROM: Full    Dental   Pulmonary          Cardiovascular hypertension, Pt. on medications     Neuro/Psych Depression    GI/Hepatic   Endo/Other    Renal/GU      Musculoskeletal   Abdominal   Peds  Hematology   Anesthesia Other Findings   Reproductive/Obstetrics                           Anesthesia Physical Anesthesia Plan  ASA: II  Anesthesia Plan: General   Post-op Pain Management:    Induction: Intravenous  Airway Management Planned: Oral ETT  Additional Equipment:   Intra-op Plan:   Post-operative Plan: Extubation in OR  Informed Consent: I have reviewed the patients History and Physical, chart, labs and discussed the procedure including the risks, benefits and alternatives for the proposed anesthesia with the patient or authorized representative who has indicated his/her understanding and acceptance.     Plan Discussed with: CRNA and Surgeon  Anesthesia Plan Comments:         Anesthesia Quick Evaluation

## 2013-05-16 NOTE — Anesthesia Postprocedure Evaluation (Signed)
Anesthesia Post Note  Patient: [name] [name]  Procedure(s) Performed: Procedure(s) (LRB): COMPLETION PAROTIDECTOMY LEFT WITH FACIAL  NERVE PRESERVATION  (Left)  Anesthesia type: general  Patient location: PACU  Post pain: Pain level controlled  Post assessment: Patient's Cardiovascular Status Stable  Last Vitals:  Filed Vitals:   05/16/13 1130  BP: 138/86  Pulse: 88  Temp: 36.2 C  Resp: 16    Post vital signs: Reviewed and stable  Level of consciousness: sedated  Complications: No apparent anesthesia complications

## 2013-05-16 NOTE — Op Note (Signed)
DATE:  17 NOV 14  TIME:1000     [name] [name], [name] [name]  MRN:  [id number]   Pre-Op Dx:   Left parotid low-grade mucoepidermoid carcinoma  Post-op Dx: Same  Proc: Completion left parotidectomy with facial nerve preservation   Surg:  Cephus Richer MD  AsstPollyann Kennedy M.D.  Anes:  GOT  EBL: 25 mL  Comp:  None  Findings:    Surgical changes consistent with superficial parotidectomy one week ago. One branch of ramus mandibularis damaged presumably at last week's surgery.  No gross residual tumor  Procedure:  With the patient in a comfortable supine position, general orotracheal anesthesia was induced without difficulty. The NIMS machine was applied.  Sutures were removed. A shoulder roll was placed and the head rotated to the right for access to the left neck. Orienting initials were identified. Timeout was performed.  1% Xylocaine with 1 100,000 epinephrine, 7 cc total was infiltrated along the skin wound for intraoperative hemostasis. Several minutes were allowed for this to take effect.  A Hibiclens sterile preparation and draping of the left face and neck was performed in the standard fashion.  The skin wound was re-incised with a 2 mm margin on each side and then carried down to the previous incision.  Chromic sutures were removed as identified. The flaps were elevated with finger dissection and retained with 2-0 silk retention sutures. Fibrin material was irrigated and removed from the field.  The findings were as described above. The facial nerve was identified.  Beginning inferiorly, the ramus mandibularis branch was followed to the periphery and then dissected from the deep parotid tissue.  Returning to the superior wound, facial nerve branches were dissected to the periphery and the superior superficial parotid gland was rolled upward off the nerve and removed from the field. Orienting stitches were applied. The nerve was observed to be intact.  Returning inferiorly, residual fibrous  tissue and perhaps and salivary tissue was dissected off of the masseter muscle and rolled backward. The deep lobe was dissected deep to the main trunk of the facial nerve away from the masseter muscle and the digastric muscle. Superficial temporal artery and vein were identified, controlled with silk ligature, and divided. The specimen was dissected underneath the inferior branch of the facial nerve down along the masseter muscle, sternocleidomastoid muscle, and digastric muscle. At its inferior aspect, superficial temporal artery and vein were again identified and controlled. Orienting stitches were applied. Specimen was delivered separately. Small amount of bipolar cautery was used for hemostasis.  The wound is irrigated with sterile saline. A Valsalva did not reveal any bleeding. A 10 French round drain was placed through an external puncture and laid into the parotid bed. It was secured on the neck skin with a 3-0 nylon stitch.    The wound was closed carefully with subcutaneous  4-0 chromic suture, and the skin was closed in a cosmetic fashion with a running simple 5-0 Ethilon stitch. Hemostasis was observed. The drain was functioning. Small amount of bacitracin ointment was applied to the wound.   At this point the procedure was completed the patient was returned anesthesia, awakened, extubated, and transferred to recovery in stable condition.    Dispo:  PACU to [facility] 23 hour observation  Plan:  Ice, elevation, analgesia, 23 hour suction drainage.  Await formal pathology reports.  Cephus Richer MD

## 2013-05-17 ENCOUNTER — Encounter (HOSPITAL_BASED_OUTPATIENT_CLINIC_OR_DEPARTMENT_OTHER): Payer: Self-pay | Admitting: Otolaryngology

## 2013-05-17 DIAGNOSIS — C07 Malignant neoplasm of parotid gland: Secondary | ICD-10-CM | POA: Diagnosis present

## 2013-05-17 MED ORDER — BACITRACIN ZINC 500 UNIT/GM EX OINT
TOPICAL_OINTMENT | CUTANEOUS | Status: AC
  Filled 2013-05-17: qty 28.35

## 2013-05-17 MED ORDER — OXYCODONE-ACETAMINOPHEN 5-325 MG PO TABS
ORAL_TABLET | ORAL | Status: AC
  Filled 2013-05-17: qty 2

## 2013-05-17 NOTE — Discharge Summary (Signed)
05/17/2013 8:23 AM  Barnett Abu [id number]  Post-Op Day 1    Temp:  [97.1 F (36.2 C)-99.4 F (37.4 C)] 97.9 F (36.6 C) (11/18 0600) Pulse Rate:  [88-114] 90 (11/18 0600) Resp:  [14-20] 16 (11/18 0600) BP: (103-139)/(63-86) 103/63 mmHg (11/18 0600) SpO2:  [94 %-100 %] 98 % (11/18 0600),     Intake/Output Summary (Last 24 hours) at 05/17/13 0823 Last data filed at 05/17/13 0700  Gross per 24 hour  Intake   3376 ml  Output 2406.5 ml  Net  969.5 ml    No results found for this or any previous visit (from the past 24 hour(s)).  SUBJECTIVE:  More pain than last time, controlled.  No N,V.  Voiding spontaneously.    OBJECTIVE:  Facial n slightly weak L.  R mandibularis more weak.    Wound flat.  Drain d/c'd  IMPRESSION:  Satisfactory check.  PLAN:  Discharge home  Admit:17 NOV Discharge:  18 NOV Final Diagnosis:  LEFT parotid mucoepidermoid cancer Proc:  Completion LEFT parotidectomy with facial nerve preservation Comp:  None Cond:  Ambulatory.  Taking good po's.  Drain out.  Pain controlled. Recheck: 7-8 days Instructions written and given Rx's:  Oxycodone  Hospital course:  Admitted for surgery and underwent completion LEFT parotidectomy with facial n preservation.  No tumor identified.  Had increased pain.  Min drainage.  No N,V.  No voiding diff.  Drain removed and discharged to home and care of family AM POD 1.    Flo Shanks

## 2013-10-25 ENCOUNTER — Other Ambulatory Visit (HOSPITAL_COMMUNITY): Payer: Self-pay | Admitting: Otolaryngology

## 2013-10-25 DIAGNOSIS — C089 Malignant neoplasm of major salivary gland, unspecified: Secondary | ICD-10-CM

## 2013-11-08 ENCOUNTER — Ambulatory Visit (HOSPITAL_COMMUNITY)
Admission: RE | Admit: 2013-11-08 | Discharge: 2013-11-08 | Disposition: A | Discharge status: Home / Self Care | Payer: 59 | Source: Ambulatory Visit | Attending: Otolaryngology | Admitting: Otolaryngology

## 2013-11-08 DIAGNOSIS — C07 Malignant neoplasm of parotid gland: Secondary | ICD-10-CM | POA: Insufficient documentation

## 2013-11-08 DIAGNOSIS — C089 Malignant neoplasm of major salivary gland, unspecified: Secondary | ICD-10-CM

## 2013-11-16 ENCOUNTER — Other Ambulatory Visit (HOSPITAL_COMMUNITY): Payer: Self-pay | Admitting: Otolaryngology

## 2013-11-16 DIAGNOSIS — C089 Malignant neoplasm of major salivary gland, unspecified: Secondary | ICD-10-CM

## 2013-11-30 ENCOUNTER — Other Ambulatory Visit (HOSPITAL_COMMUNITY): Payer: 59

## 2013-12-01 ENCOUNTER — Ambulatory Visit (HOSPITAL_COMMUNITY)
Admission: RE | Admit: 2013-12-01 | Discharge: 2013-12-01 | Disposition: A | Discharge status: Home / Self Care | Payer: 59 | Source: Ambulatory Visit | Attending: Otolaryngology | Admitting: Otolaryngology

## 2013-12-01 DIAGNOSIS — C089 Malignant neoplasm of major salivary gland, unspecified: Secondary | ICD-10-CM | POA: Insufficient documentation

## 2013-12-01 LAB — GLUCOSE, CAPILLARY: GLUCOSE-CAPILLARY: 80 mg/dL (ref 70–99)

## 2013-12-01 MED ORDER — FLUDEOXYGLUCOSE F - 18 (FDG) INJECTION: 12.4000 | Freq: Once | INTRAVENOUS | Status: AC | PRN

## 2013-12-07 ENCOUNTER — Other Ambulatory Visit: Payer: Self-pay | Admitting: Otolaryngology

## 2013-12-07 DIAGNOSIS — C089 Malignant neoplasm of major salivary gland, unspecified: Secondary | ICD-10-CM

## 2013-12-12 ENCOUNTER — Other Ambulatory Visit: Payer: Self-pay | Admitting: Otolaryngology

## 2013-12-12 DIAGNOSIS — C089 Malignant neoplasm of major salivary gland, unspecified: Secondary | ICD-10-CM

## 2013-12-16 ENCOUNTER — Ambulatory Visit (HOSPITAL_COMMUNITY): Payer: 59

## 2013-12-21 ENCOUNTER — Ambulatory Visit (HOSPITAL_COMMUNITY): Payer: 59

## 2013-12-22 ENCOUNTER — Ambulatory Visit (HOSPITAL_COMMUNITY)
Admission: RE | Admit: 2013-12-22 | Discharge: 2013-12-22 | Disposition: A | Discharge status: Home / Self Care | Payer: 59 | Source: Ambulatory Visit | Attending: Otolaryngology | Admitting: Otolaryngology

## 2013-12-22 ENCOUNTER — Other Ambulatory Visit (HOSPITAL_COMMUNITY): Payer: 59

## 2013-12-22 DIAGNOSIS — Z8589 Personal history of malignant neoplasm of other organs and systems: Secondary | ICD-10-CM | POA: Insufficient documentation

## 2013-12-22 DIAGNOSIS — C089 Malignant neoplasm of major salivary gland, unspecified: Secondary | ICD-10-CM

## 2013-12-23 ENCOUNTER — Other Ambulatory Visit: Payer: Self-pay | Admitting: Radiology

## 2013-12-26 ENCOUNTER — Encounter (HOSPITAL_COMMUNITY): Payer: Self-pay | Admitting: Pharmacy Technician

## 2013-12-26 ENCOUNTER — Encounter (HOSPITAL_COMMUNITY): Payer: Self-pay

## 2013-12-26 ENCOUNTER — Ambulatory Visit (HOSPITAL_COMMUNITY)
Admission: RE | Admit: 2013-12-26 | Discharge: 2013-12-26 | Disposition: A | Discharge status: Home / Self Care | Payer: 59 | Source: Ambulatory Visit | Attending: Otolaryngology | Admitting: Otolaryngology

## 2013-12-26 ENCOUNTER — Other Ambulatory Visit: Payer: Self-pay | Admitting: Otolaryngology

## 2013-12-26 DIAGNOSIS — F3289 Other specified depressive episodes: Secondary | ICD-10-CM | POA: Insufficient documentation

## 2013-12-26 DIAGNOSIS — C089 Malignant neoplasm of major salivary gland, unspecified: Secondary | ICD-10-CM

## 2013-12-26 DIAGNOSIS — Z882 Allergy status to sulfonamides status: Secondary | ICD-10-CM | POA: Insufficient documentation

## 2013-12-26 DIAGNOSIS — I10 Essential (primary) hypertension: Secondary | ICD-10-CM | POA: Insufficient documentation

## 2013-12-26 DIAGNOSIS — F329 Major depressive disorder, single episode, unspecified: Secondary | ICD-10-CM | POA: Insufficient documentation

## 2013-12-26 DIAGNOSIS — Z881 Allergy status to other antibiotic agents status: Secondary | ICD-10-CM | POA: Insufficient documentation

## 2013-12-26 DIAGNOSIS — Z85819 Personal history of malignant neoplasm of unspecified site of lip, oral cavity, and pharynx: Secondary | ICD-10-CM | POA: Insufficient documentation

## 2013-12-26 LAB — CBC
HCT: 34.2 % — ABNORMAL LOW (ref 36.0–46.0)
Hemoglobin: 11 g/dL — ABNORMAL LOW (ref 12.0–15.0)
MCH: 27.6 pg (ref 26.0–34.0)
MCHC: 32.2 g/dL (ref 30.0–36.0)
MCV: 85.9 fL (ref 78.0–100.0)
PLATELETS: 348 10*3/uL (ref 150–400)
RBC: 3.98 MIL/uL (ref 3.87–5.11)
RDW: 14.5 % (ref 11.5–15.5)
WBC: 8.7 10*3/uL (ref 4.0–10.5)

## 2013-12-26 LAB — PROTIME-INR
INR: 1.01 (ref 0.00–1.49)
Prothrombin Time: 13.3 seconds (ref 11.6–15.2)

## 2013-12-26 LAB — APTT: APTT: 31 s (ref 24–37)

## 2013-12-26 MED ORDER — MIDAZOLAM HCL 2 MG/2ML IJ SOLN
INTRAMUSCULAR | Status: DC | PRN
  Administered 2013-12-26 (×2): 0.5 mg via INTRAVENOUS
  Administered 2013-12-26: 1 mg via INTRAVENOUS

## 2013-12-26 MED ORDER — IBUPROFEN 400 MG PO TABS: 400.0000 mg | ORAL_TABLET | Freq: Once | ORAL | Status: DC

## 2013-12-26 MED ORDER — IBUPROFEN 200 MG PO TABS
ORAL_TABLET | ORAL | Status: AC
  Filled 2013-12-26: qty 2

## 2013-12-26 MED ORDER — SODIUM CHLORIDE 0.9 % IV SOLN
INTRAVENOUS | Status: DC
  Administered 2013-12-26: 13:00:00 via INTRAVENOUS

## 2013-12-26 MED ORDER — FENTANYL CITRATE 0.05 MG/ML IJ SOLN
INTRAMUSCULAR | Status: DC | PRN
Start: 2013-12-26 — End: 2013-12-27
  Administered 2013-12-26: 12.5 ug via INTRAVENOUS
  Administered 2013-12-26: 25 ug via INTRAVENOUS
  Administered 2013-12-26: 12.5 ug via INTRAVENOUS
  Administered 2013-12-26: 50 ug via INTRAVENOUS

## 2013-12-26 MED ORDER — FENTANYL CITRATE 0.05 MG/ML IJ SOLN
INTRAMUSCULAR | Status: DC
Start: 2013-12-26 — End: 2013-12-27
  Filled 2013-12-26: qty 2

## 2013-12-26 MED ORDER — IBUPROFEN 200 MG PO TABS
ORAL_TABLET | ORAL | Status: AC
  Administered 2013-12-26: 400 mg
  Filled 2013-12-26: qty 1

## 2013-12-26 MED ORDER — SODIUM CHLORIDE 0.9 % IV SOLN
INTRAVENOUS | Status: DC | PRN
  Administered 2013-12-26: 50 mL/h via INTRAVENOUS

## 2013-12-26 MED ORDER — MIDAZOLAM HCL 2 MG/2ML IJ SOLN
INTRAMUSCULAR | Status: DC
Start: 2013-12-26 — End: 2013-12-27
  Filled 2013-12-26: qty 4

## 2013-12-26 NOTE — H&P (Signed)
Agree.  For [place] FNA of small nodule(s).

## 2013-12-26 NOTE — Sedation Documentation (Signed)
Review discharge instructions with pt and spouse at bs

## 2013-12-26 NOTE — H&P (Signed)
[name] [name] is an 38 y.o. female.   Chief Complaint: Pt with Hx parotid gland cancer - Mucoepidermoid cancer Left parotid gland removal Nov 2014 Pt followed by Dr Erik Obey Recent CT reveals abnormal/enlargement of soft tissue/lymph node areas in this area. PET neg Scheduled now for biopsy of same  HPI: HTN; parotid ca  Past Medical History  Diagnosis Date  . Depression   . Hypertension     under control with med., has been on med. x 5 yr.  . History of MRSA infection "years ago"    axilla  . 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 Microdiscectomy lumbar  06/2012    L5  . Cesarean section    . Parotidectomy Left 05/09/2013    Procedure: LEFT SUPERFICIAL PAROTIDECTOMY;  Surgeon: Jodi Marble, MD;  Location: [facility];  Service: ENT;  Laterality: Left;  . Parotidectomy Left 05/16/2013    Procedure: COMPLETION PAROTIDECTOMY LEFT WITH FACIAL  NERVE PRESERVATION ;  Surgeon: Jodi Marble, MD;  Location: [facility];  Service: ENT;  Laterality: Left;    No family history on file. Social History:  reports that she has quit smoking. She has never used smokeless tobacco. She reports that she does not drink alcohol or use illicit drugs.  Allergies:  Allergies  Allergen Reactions  . Keflex [Cephalexin] Rash  . Sulfa Antibiotics Rash     (Not in a hospital admission)  Results for orders placed during the hospital encounter of 12/26/13 (from the past 48 hour(s))  APTT     Status: None   Collection Time    12/26/13 12:27 PM      Result Value Ref Range   aPTT 31  24 - 37 seconds  CBC     Status: Abnormal   Collection Time    12/26/13 12:27 PM      Result Value Ref Range   WBC 8.7  4.0 - 10.5 K/uL   RBC 3.98  3.87 - 5.11 MIL/uL   Hemoglobin 11.0 (*) 12.0 - 15.0 g/dL   HCT 34.2 (*) 36.0 - 46.0 %   MCV 85.9  78.0 - 100.0 fL   MCH 27.6  26.0 - 34.0 pg   MCHC 32.2  30.0 - 36.0 g/dL   RDW  14.5  11.5 - 15.5 %   Platelets 348  150 - 400 K/uL  PROTIME-INR     Status: None   Collection Time    12/26/13 12:27 PM      Result Value Ref Range   Prothrombin Time 13.3  11.6 - 15.2 seconds   INR 1.01  0.00 - 1.49   No results found.  Review of Systems  Constitutional: Negative for fever and weight loss.  Respiratory: Negative for shortness of breath.   Cardiovascular: Negative for chest pain.  Gastrointestinal: Negative for nausea, vomiting and abdominal pain.  Musculoskeletal: Negative for neck pain.  Neurological: Negative for dizziness, weakness and headaches.  Psychiatric/Behavioral: Negative for substance abuse.    Blood pressure 114/59, pulse 83, temperature 97.9 F (36.6 C), temperature source Oral, resp. rate 20, height 5\' 6"  (1.676 m), weight 106.595 kg (235 lb), last menstrual period 12/12/2013, SpO2 99.00%. Physical Exam  Constitutional: She is oriented to person, place, and time. She appears well-nourished.  Cardiovascular: Normal rate and regular rhythm.   No murmur heard. Respiratory: Effort normal and breath sounds normal. She has no wheezes.  GI: Soft. Bowel sounds are normal. There is no tenderness.  Musculoskeletal: Normal range of motion.  Neurological: She is alert and oriented to person, place, and time.  Skin: Skin is warm and dry.  Psychiatric: She has a normal mood and affect. Her behavior is normal. Judgment and thought content normal.     Assessment/Plan Parotid ca 2014 L parotidectomy 04/2013 Now with enlargement of ST/LAN on CT Scheduled for biopsy of same Pt aware of procedure benefits and risks and agreeable to proceed Consent signed and in chart  [place] A 12/26/2013, 1:24 PM

## 2013-12-26 NOTE — Sedation Documentation (Signed)
Patient denies pain and is resting comfortably.  

## 2013-12-26 NOTE — Sedation Documentation (Signed)
MD at bedside.update family at bs

## 2013-12-26 NOTE — Discharge Instructions (Signed)
Conscious Sedation, Adult, Care After °Refer to this sheet in the next few weeks. These instructions provide you with information on caring for yourself after your procedure. Your health care provider may also give you more specific instructions. Your treatment has been planned according to current medical practices, but problems sometimes occur. Call your health care provider if you have any problems or questions after your procedure. °WHAT TO EXPECT AFTER THE PROCEDURE  °After your procedure: °· You may feel sleepy, clumsy, and have poor balance for several hours. °· Vomiting may occur if you eat too soon after the procedure. °HOME CARE INSTRUCTIONS °· Do not participate in any activities where you could become injured for at least 24 hours. Do not: °¨ Drive. °¨ Swim. °¨ Ride a bicycle. °¨ Operate heavy machinery. °¨ Cook. °¨ Use power tools. °¨ Climb ladders. °¨ Work from a high place. °· Do not make important decisions or sign legal documents until you are improved. °· If you vomit, drink water, juice, or soup when you can drink without vomiting. Make sure you have little or no nausea before eating solid foods. °· Only take over-the-counter or prescription medicines for pain, discomfort, or fever as directed by your health care provider. °· Make sure you and your family fully understand everything about the medicines given to you, including what side effects may occur. °· You should not drink alcohol, take sleeping pills, or take medicines that cause drowsiness for at least 24 hours. °· If you smoke, do not smoke without supervision. °· If you are feeling better, you may resume normal activities 24 hours after you were sedated. °· Keep all appointments with your health care provider. °SEEK MEDICAL CARE IF: °· Your skin is pale or bluish in color. °· You continue to feel nauseous or vomit. °· Your pain is getting worse and is not helped by medicine. °· You have bleeding or swelling. °· You are still sleepy or  feeling clumsy after 24 hours. °SEEK IMMEDIATE MEDICAL CARE IF: °· You develop a rash. °· You have difficulty breathing. °· You develop any type of allergic problem. °· You have a fever. °MAKE SURE YOU: °· Understand these instructions. °· Will watch your condition. °· Will get help right away if you are not doing well or get worse. °Document Released: 04/06/2013 Document Reviewed: 04/06/2013 °[employer]® Patient Information ©2015 [employer]. This information is not intended to replace advice given to you by your health care provider. Make sure you discuss any questions you have with your health care provider. °  °

## 2013-12-26 NOTE — Sedation Documentation (Addendum)
Patient denies pain and is resting comfortably.  

## 2013-12-26 NOTE — Procedures (Signed)
Procedure:  Ultrasound guided FNA biopsy of left facial nodules x 2 Findings:  2 separate soft tissue nodules in left parotid bed.  FNA of both superior and inferior nodules with 25 G needles.  4 passes through each nodule.

## 2013-12-27 ENCOUNTER — Ambulatory Visit (HOSPITAL_COMMUNITY): Payer: 59

## 2014-01-09 ENCOUNTER — Encounter (HOSPITAL_COMMUNITY): Payer: Self-pay

## 2014-04-24 ENCOUNTER — Encounter (HOSPITAL_BASED_OUTPATIENT_CLINIC_OR_DEPARTMENT_OTHER): Payer: Self-pay | Admitting: Emergency Medicine

## 2014-04-24 ENCOUNTER — Emergency Department (HOSPITAL_BASED_OUTPATIENT_CLINIC_OR_DEPARTMENT_OTHER)
Admission: EM | Admit: 2014-04-24 | Discharge: 2014-04-24 | Disposition: A | Discharge status: Home / Self Care | Payer: 59 | Attending: Emergency Medicine | Admitting: Emergency Medicine

## 2014-04-24 DIAGNOSIS — Z8614 Personal history of Methicillin resistant Staphylococcus aureus infection: Secondary | ICD-10-CM | POA: Diagnosis not present

## 2014-04-24 DIAGNOSIS — Z87891 Personal history of nicotine dependence: Secondary | ICD-10-CM | POA: Insufficient documentation

## 2014-04-24 DIAGNOSIS — F329 Major depressive disorder, single episode, unspecified: Secondary | ICD-10-CM | POA: Diagnosis not present

## 2014-04-24 DIAGNOSIS — Z3202 Encounter for pregnancy test, result negative: Secondary | ICD-10-CM | POA: Insufficient documentation

## 2014-04-24 DIAGNOSIS — M5126 Other intervertebral disc displacement, lumbar region: Secondary | ICD-10-CM | POA: Insufficient documentation

## 2014-04-24 DIAGNOSIS — I10 Essential (primary) hypertension: Secondary | ICD-10-CM | POA: Diagnosis not present

## 2014-04-24 DIAGNOSIS — Y9389 Activity, other specified: Secondary | ICD-10-CM | POA: Diagnosis not present

## 2014-04-24 DIAGNOSIS — X58XXXA Exposure to other specified factors, initial encounter: Secondary | ICD-10-CM | POA: Diagnosis not present

## 2014-04-24 DIAGNOSIS — M5136 Other intervertebral disc degeneration, lumbar region: Secondary | ICD-10-CM

## 2014-04-24 DIAGNOSIS — S39012A Strain of muscle, fascia and tendon of lower back, initial encounter: Secondary | ICD-10-CM | POA: Insufficient documentation

## 2014-04-24 DIAGNOSIS — Z79899 Other long term (current) drug therapy: Secondary | ICD-10-CM | POA: Insufficient documentation

## 2014-04-24 DIAGNOSIS — M549 Dorsalgia, unspecified: Secondary | ICD-10-CM | POA: Diagnosis present

## 2014-04-24 DIAGNOSIS — Z85858 Personal history of malignant neoplasm of other endocrine glands: Secondary | ICD-10-CM | POA: Insufficient documentation

## 2014-04-24 DIAGNOSIS — Y9289 Other specified places as the place of occurrence of the external cause: Secondary | ICD-10-CM | POA: Insufficient documentation

## 2014-04-24 HISTORY — DX: Other intervertebral disc degeneration, lumbar region without mention of lumbar back pain or lower extremity pain: M51.369

## 2014-04-24 HISTORY — DX: Other intervertebral disc degeneration, lumbar region: M51.36

## 2014-04-24 HISTORY — DX: Other intervertebral disc displacement, lumbar region: M51.26

## 2014-04-24 LAB — URINALYSIS, ROUTINE W REFLEX MICROSCOPIC
Bilirubin Urine: NEGATIVE
GLUCOSE, UA: NEGATIVE mg/dL
KETONES UR: NEGATIVE mg/dL
Leukocytes, UA: NEGATIVE
Nitrite: NEGATIVE
PROTEIN: NEGATIVE mg/dL
Specific Gravity, Urine: 1.008 (ref 1.005–1.030)
Urobilinogen, UA: 0.2 mg/dL (ref 0.0–1.0)
pH: 5 (ref 5.0–8.0)

## 2014-04-24 LAB — URINE MICROSCOPIC-ADD ON

## 2014-04-24 LAB — PREGNANCY, URINE: Preg Test, Ur: NEGATIVE

## 2014-04-24 MED ORDER — KETOROLAC TROMETHAMINE 60 MG/2ML IM SOLN
60.0000 mg | Freq: Once | INTRAMUSCULAR | Status: AC
  Administered 2014-04-24: 60 mg via INTRAMUSCULAR
  Filled 2014-04-24: qty 2

## 2014-04-24 MED ORDER — OXYCODONE-ACETAMINOPHEN 5-325 MG PO TABS: 1.0000 | ORAL_TABLET | Freq: Four times a day (QID) | ORAL | Status: DC | PRN

## 2014-04-24 MED ORDER — CYCLOBENZAPRINE HCL ER 15 MG PO CP24: 15.0000 mg | ORAL_CAPSULE | Freq: Every day | ORAL | Status: DC | PRN

## 2014-04-24 MED ORDER — DIAZEPAM 5 MG PO TABS
5.0000 mg | ORAL_TABLET | Freq: Once | ORAL | Status: AC
  Administered 2014-04-24: 5 mg via ORAL
  Filled 2014-04-24: qty 1

## 2014-04-24 NOTE — ED Notes (Signed)
Lower back pain since 9 am.  Pt having difficulty with ROM.  Some urinary frequency.

## 2014-04-24 NOTE — ED Provider Notes (Signed)
CSN: [id number]     Arrival date & time 04/24/14  1355 History   First MD Initiated Contact with Patient 04/24/14 1402     Chief Complaint  Patient presents with  . Back Pain     (Consider location/radiation/quality/duration/timing/severity/associated sxs/prior Treatment) Patient is a 38 y.o. female presenting with back pain. The history is provided by the patient.  Back Pain Location:  Lumbar spine Quality:  Aching, stabbing and stiffness Stiffness is present:  All day Radiates to:  Does not radiate Pain severity:  Severe Onset quality:  Sudden Duration:  6 hours Timing:  Constant Progression:  Worsening Chronicity:  Recurrent Context comment:  Hx of bad back with bulging disc and prior microdiscectomy and was getting out of a chair and felt it go out Relieved by:  Nothing Worsened by:  Bending, standing and ambulation Ineffective treatments: prednisone. Associated symptoms: no abdominal pain, no bladder incontinence, no bowel incontinence, no leg pain, no numbness, no perianal numbness and no weakness   Risk factors: steroid use     Past Medical History  Diagnosis Date  . Depression   . Hypertension     under control with med., has been on med. x 5 yr.  . History of MRSA infection "years ago"    axilla  . 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 Bulging lumbar disc    Past Surgical History  Procedure Laterality Date  . Microdiscectomy lumbar  06/2012    L5  . Cesarean section    . Parotidectomy Left 05/09/2013    Procedure: LEFT SUPERFICIAL PAROTIDECTOMY;  Surgeon: Jodi Marble, MD;  Location: [facility];  Service: ENT;  Laterality: Left;  . Parotidectomy Left 05/16/2013    Procedure: COMPLETION PAROTIDECTOMY LEFT WITH FACIAL  NERVE PRESERVATION ;  Surgeon: Jodi Marble, MD;  Location: [facility];  Service: ENT;  Laterality: Left;   No family history on file. History  Substance Use Topics  . Smoking status: Former Research scientist (life sciences)  .  Smokeless tobacco: Never Used     Comment: quit smoking 2007  . Alcohol Use: No   OB History   Grav Para Term Preterm Abortions TAB SAB Ect Mult Living                 Review of Systems  Gastrointestinal: Negative for abdominal pain and bowel incontinence.  Genitourinary: Negative for bladder incontinence.  Musculoskeletal: Positive for back pain.  Neurological: Negative for weakness and numbness.  All other systems reviewed and are negative.     Allergies  Keflex and Sulfa antibiotics  Home Medications   Prior to Admission medications   Medication Sig Start Date End Date Taking? Authorizing Provider  predniSONE (DELTASONE) 10 MG tablet Take 30 mg by mouth once.   Yes Historical Provider, MD  cholecalciferol (VITAMIN D) 1000 UNITS tablet Take 1,000 Units by mouth daily.    Historical Provider, MD  citalopram (CELEXA) 20 MG tablet Take 30 mg by mouth daily.    Historical Provider, MD  cyclobenzaprine (AMRIX) 15 MG 24 hr capsule Take 15 mg by mouth daily as needed for muscle spasms.    Historical Provider, MD  lisinopril (PRINIVIL,ZESTRIL) 20 MG tablet Take 20 mg by mouth daily.    Historical Provider, MD   BP 131/81  Pulse 96  Temp(Src) 98.1 F (36.7 C)  Resp 16  Wt 235 lb (106.595 kg)  SpO2 96%  LMP 04/12/2014 Physical Exam  Nursing note and vitals reviewed. Constitutional:  She is oriented to person, place, and time. She appears well-developed and well-nourished. She appears distressed.  HENT:  Head: Normocephalic and atraumatic.  Eyes: EOM are normal. Pupils are equal, round, and reactive to light.  Neck: No spinous process tenderness and no muscular tenderness present. No rigidity. Normal range of motion present.  Cardiovascular: Normal rate and intact distal pulses.   Pulmonary/Chest: Effort normal.  Abdominal: There is no CVA tenderness.  Musculoskeletal: She exhibits tenderness.       Lumbar back: She exhibits decreased range of motion, pain and spasm. She  exhibits no swelling, no deformity and normal pulse.  Neurological: She is alert and oriented to person, place, and time. She has normal strength. No sensory deficit. Coordination normal.  Skin: Skin is warm and dry. No rash noted.  Psychiatric: She has a normal mood and affect.    ED Course  Procedures (including critical care time) Labs Review Labs Reviewed  URINALYSIS, ROUTINE W REFLEX MICROSCOPIC - Abnormal; Notable for the following:    Hgb urine dipstick TRACE (*)    All other components within normal limits  PREGNANCY, URINE  URINE MICROSCOPIC-ADD ON    Imaging Review No results found.   EKG Interpretation None      MDM   Final diagnoses:  Lumbar strain, initial encounter  Bulging lumbar disc    Patient with a history of back problems status post microdiscectomy approximately 1 year ago who is having a flare in her chronic back pain after getting out of a chair. She tried taking prednisone which is typically what she uses on her back starts bothering her but it is not helping. She denies any radiating pain into the legs or any urinary or bowel symptoms.  Patient given Toradol and valium for pain.  LMP 2 weeks ago.    UA pending.  3:05 PM UA wnl.  Monitoring to see if pain improves with meds and will send home with supportive care.    Blanchie Dessert, MD 04/24/14 1505

## 2014-05-22 ENCOUNTER — Other Ambulatory Visit (HOSPITAL_COMMUNITY): Payer: Self-pay | Admitting: Otolaryngology

## 2014-05-22 DIAGNOSIS — C089 Malignant neoplasm of major salivary gland, unspecified: Secondary | ICD-10-CM

## 2014-05-30 ENCOUNTER — Ambulatory Visit (HOSPITAL_COMMUNITY): Payer: 59

## 2014-06-15 ENCOUNTER — Ambulatory Visit (HOSPITAL_COMMUNITY)
Admission: RE | Admit: 2014-06-15 | Discharge: 2014-06-15 | Disposition: A | Discharge status: Home / Self Care | Payer: 59 | Source: Ambulatory Visit | Attending: Otolaryngology | Admitting: Otolaryngology

## 2014-06-15 DIAGNOSIS — C089 Malignant neoplasm of major salivary gland, unspecified: Secondary | ICD-10-CM

## 2014-06-15 DIAGNOSIS — R221 Localized swelling, mass and lump, neck: Secondary | ICD-10-CM | POA: Diagnosis not present

## 2014-06-15 DIAGNOSIS — Z8589 Personal history of malignant neoplasm of other organs and systems: Secondary | ICD-10-CM | POA: Diagnosis not present

## 2014-06-15 MED ORDER — IOHEXOL 300 MG/ML  SOLN
80.0000 mL | Freq: Once | INTRAMUSCULAR | Status: AC | PRN
  Administered 2014-06-15: 80 mL via INTRAVENOUS

## 2014-06-30 HISTORY — PX: URETHRAL SLING: SHX2621

## 2014-10-26 ENCOUNTER — Other Ambulatory Visit (HOSPITAL_COMMUNITY): Payer: Self-pay | Admitting: Otolaryngology

## 2014-10-26 DIAGNOSIS — C089 Malignant neoplasm of major salivary gland, unspecified: Secondary | ICD-10-CM

## 2014-10-28 ENCOUNTER — Ambulatory Visit (HOSPITAL_BASED_OUTPATIENT_CLINIC_OR_DEPARTMENT_OTHER)
Admission: RE | Admit: 2014-10-28 | Discharge: 2014-10-28 | Disposition: A | Discharge status: Home / Self Care | Payer: 59 | Source: Ambulatory Visit | Attending: Otolaryngology | Admitting: Otolaryngology

## 2014-10-28 ENCOUNTER — Encounter (HOSPITAL_BASED_OUTPATIENT_CLINIC_OR_DEPARTMENT_OTHER): Payer: Self-pay

## 2014-10-28 DIAGNOSIS — Z87891 Personal history of nicotine dependence: Secondary | ICD-10-CM | POA: Insufficient documentation

## 2014-10-28 DIAGNOSIS — C089 Malignant neoplasm of major salivary gland, unspecified: Secondary | ICD-10-CM | POA: Diagnosis not present

## 2014-10-28 MED ORDER — IOHEXOL 300 MG/ML  SOLN
80.0000 mL | Freq: Once | INTRAMUSCULAR | Status: AC | PRN
  Administered 2014-10-28: 80 mL via INTRAVENOUS

## 2014-10-30 ENCOUNTER — Ambulatory Visit (HOSPITAL_COMMUNITY): Payer: 59

## 2015-04-02 DIAGNOSIS — N3281 Overactive bladder: Secondary | ICD-10-CM | POA: Insufficient documentation

## 2015-06-23 ENCOUNTER — Telehealth: Payer: 59 | Admitting: Nurse Practitioner

## 2015-06-23 DIAGNOSIS — J0101 Acute recurrent maxillary sinusitis: Secondary | ICD-10-CM

## 2015-06-23 MED ORDER — AZITHROMYCIN 250 MG PO TABS: ORAL_TABLET | ORAL | Status: DC

## 2015-06-23 NOTE — Progress Notes (Signed)

## 2015-07-05 MED FILL — predniSONE 5 MG (21) TBPK: 5 | 6 days supply | Qty: 21 | Fill #0

## 2015-07-05 MED FILL — AMRIX 15 MG CAPSULE ER: 15 | 30 days supply | Qty: 30 | Fill #0

## 2015-07-23 MED FILL — LISINOPRIL 20 MG TABLET: 20 | 90 days supply | Qty: 90 | Fill #2

## 2015-08-13 MED FILL — MELOXICAM 15 MG TABLET: 15 | 30 days supply | Qty: 30 | Fill #2

## 2015-08-14 MED FILL — CITALOPRAM HBR 20 MG TABLET: 20 | 90 days supply | Qty: 135 | Fill #0

## 2015-09-07 DIAGNOSIS — N3281 Overactive bladder: Secondary | ICD-10-CM | POA: Diagnosis not present

## 2015-09-07 DIAGNOSIS — R339 Retention of urine, unspecified: Secondary | ICD-10-CM | POA: Diagnosis not present

## 2015-09-07 DIAGNOSIS — N3946 Mixed incontinence: Secondary | ICD-10-CM | POA: Diagnosis not present

## 2015-09-24 MED FILL — MELOXICAM 15 MG TABLET: 15 | 30 days supply | Qty: 30 | Fill #0

## 2015-10-02 MED FILL — predniSONE 10 MG TABS: 10 | 6 days supply | Qty: 21 | Fill #0

## 2015-10-30 MED FILL — LISINOPRIL 20 MG TABLET: 20 | 90 days supply | Qty: 90 | Fill #3

## 2015-11-02 MED FILL — MELOXICAM 15 MG TABLET: 15 | 30 days supply | Qty: 30 | Fill #1

## 2015-11-14 ENCOUNTER — Telehealth: Payer: 59 | Admitting: Family

## 2015-11-14 DIAGNOSIS — L259 Unspecified contact dermatitis, unspecified cause: Secondary | ICD-10-CM | POA: Diagnosis not present

## 2015-11-14 MED ORDER — TRIAMCINOLONE ACETONIDE 0.5 % EX CREA: 1.0000 "application " | TOPICAL_CREAM | Freq: Two times a day (BID) | CUTANEOUS | Status: DC

## 2015-11-14 MED ORDER — PREDNISONE 10 MG PO TABS: 10.0000 mg | ORAL_TABLET | Freq: Every day | ORAL | Status: DC

## 2015-11-14 NOTE — Progress Notes (Signed)
E Visit for Rash  We are sorry that you are not feeling well. Here is how we plan to help!  Based on what you shared with me it looks like you have contact dermatitis.  Contact dermatitis is a skin rash caused by something that touches the skin and causes irritation or inflammation.  Your skin may be red, swollen, dry, cracked, and itch.  The rash should go away in a few days but can last a few weeks.  If you get a rash, it's important to figure out what caused it so the irritant can be avoided in the future. and I have prescribed Prednisone 10 mg daily for 5 days and kenalog cream 0.5% that you will apply twice daily.     HOME CARE:   Take cool showers and avoid direct sunlight.  Apply cool compress or wet dressings.  Take a bath in an oatmeal bath.  Sprinkle content of one Aveeno packet under running faucet with comfortably warm water.  Bathe for 15-20 minutes, 1-2 times daily.  Pat dry with a towel. Do not rub the rash.  Use hydrocortisone cream.  Take an antihistamine like Benadryl for widespread rashes that itch.  The adult dose of Benadryl is 25-50 mg by mouth 4 times daily.  Caution:  This type of medication may cause sleepiness.  Do not drink alcohol, drive, or operate dangerous machinery while taking antihistamines.  Do not take these medications if you have prostate enlargement.  Read package instructions thoroughly on all medications that you take.  GET HELP RIGHT AWAY IF:   Symptoms don't go away after treatment.  Severe itching that persists.  If you rash spreads or swells.  If you rash begins to smell.  If it blisters and opens or develops a yellow-brown crust.  You develop a fever.  You have a sore throat.  You become short of breath.  MAKE SURE YOU:  Understand these instructions. Will watch your condition. Will get help right away if you are not doing well or get worse.  Thank you for choosing an e-visit. Your e-visit answers were reviewed by a board  certified advanced clinical practitioner to complete your personal care plan. Depending upon the condition, your plan could have included both over the counter or prescription medications. Please review your pharmacy choice. Be sure that the pharmacy you have chosen is open so that you can pick up your prescription now.  If there is a problem you may message your provider in [place] to have the prescription routed to another pharmacy. Your safety is important to [place]. If you have drug allergies check your prescription carefully.  For the next 24 hours, you can use MyChart to ask questions about today's visit, request a non-urgent call back, or ask for a work or school excuse from your e-visit provider. You will get an email in the next two days asking about your experience. I hope that your e-visit has been valuable and will speed your recovery.

## 2015-11-16 ENCOUNTER — Emergency Department
Admission: EM | Admit: 2015-11-16 | Discharge: 2015-11-16 | Disposition: A | Discharge status: Home / Self Care | Payer: 59 | Source: Home / Self Care | Attending: Family Medicine | Admitting: Family Medicine

## 2015-11-16 DIAGNOSIS — L298 Other pruritus: Secondary | ICD-10-CM | POA: Diagnosis not present

## 2015-11-16 MED ORDER — HYDROXYZINE HCL 25 MG PO TABS: 25.0000 mg | ORAL_TABLET | Freq: Four times a day (QID) | ORAL | Status: DC

## 2015-11-16 MED ORDER — TRIAMCINOLONE ACETONIDE 40 MG/ML IJ SUSP
60.0000 mg | Freq: Once | INTRAMUSCULAR | Status: AC
  Administered 2015-11-16: 60 mg via INTRAMUSCULAR

## 2015-11-16 MED ORDER — PREDNISONE 20 MG PO TABS: ORAL_TABLET | ORAL | Status: DC

## 2015-11-16 MED ORDER — TRIAMCINOLONE ACETONIDE 40 MG/ML IJ SUSP: 40.0000 mg | Freq: Once | INTRAMUSCULAR | Status: DC

## 2015-11-16 MED FILL — predniSONE 20 MG TABS: 20 | 5 days supply | Qty: 11 | Fill #0

## 2015-11-16 MED FILL — hydrOXYzine HCL 25 MG TABS: 25 | 3 days supply | Qty: 12 | Fill #0

## 2015-11-16 NOTE — ED Notes (Signed)
Discharge instructions give and specified to start the prednisone tomorrow due to the Kenalog shot.

## 2015-11-16 NOTE — ED Notes (Signed)
Pt started with a rash Monday.  Has an e-visit and was started on 10 MG prednisone- has had 4 doses and rash is becoming worse.  Stated that her throat has become itchy today.  Rash on trunk and appendages.

## 2015-11-16 NOTE — ED Provider Notes (Signed)
CSN: [id number]     Arrival date & time 11/16/15  [id number] History   First MD Initiated Contact with Patient 11/16/15 0845     Chief Complaint  Patient presents with  . Rash   (Consider location/radiation/quality/duration/timing/severity/associated sxs/prior Treatment) HPI  The pt is a 40yo female presenting to [facility] with c/o erythematous pruritic rash that has been gradually worsening for about 1 week.  Pt did an e-visit the day the rash started and was prescribed 5 days of 10mg  prednisone.  She has taken 4 doses and has been taking benadryl and zyrtec but no relief. Rash is moderately pruritic on arms, trunk, and back.  She believes the rash is due to changing laundry detergents. She is in the process of re-washing those clothes. She cannot think of any other new soaps, foods, medications, or lotions that could have caused the rash. No exposure to known allergens. No one else in the household with a rash. Denies recent travel. Denies fever, chills, n/v/d. Denies oral swelling or difficulty breathing.   Past Medical History  Diagnosis Date  . Depression   . Hypertension     under control with med., has been on med. x 5 yr.  . History of MRSA infection "years ago"    axilla  . Cancer of parotid gland ([place]) 04/2013    left  . Bulging lumbar disc    Past Surgical History  Procedure Laterality Date  . Microdiscectomy lumbar  06/2012    L5  . Cesarean section    . Parotidectomy Left 05/09/2013    Procedure: LEFT SUPERFICIAL PAROTIDECTOMY;  Surgeon: Jodi Marble, MD;  Location: [place] [facility];  Service: ENT;  Laterality: Left;  . Parotidectomy Left 05/16/2013    Procedure: COMPLETION PAROTIDECTOMY LEFT WITH FACIAL  NERVE PRESERVATION ;  Surgeon: Jodi Marble, MD;  Location: [facility];  Service: ENT;  Laterality: Left;   History reviewed. No pertinent family history. Social History  Substance Use Topics  . Smoking status: Former Research scientist (life sciences)  . Smokeless tobacco: Never  Used     Comment: quit smoking 2007  . Alcohol Use: No   OB History    No data available     Review of Systems  Constitutional: Negative for fever and chills.  HENT: Negative for trouble swallowing and voice change.   Respiratory: Negative for chest tightness, shortness of breath, wheezing and stridor.   Gastrointestinal: Negative for nausea and vomiting.  Skin: Positive for rash. Negative for wound.    Allergies  Keflex and Sulfa antibiotics  Home Medications   Prior to Admission medications   Medication Sig Start Date End Date Taking? Authorizing Provider  azithromycin (ZITHROMAX Z-PAK) 250 MG tablet As directed 06/23/15   Mary-Margaret Hassell Done, FNP  cholecalciferol (VITAMIN D) 1000 UNITS tablet Take 1,000 Units by mouth daily.    Historical Provider, MD  citalopram (CELEXA) 20 MG tablet Take 30 mg by mouth daily.    Historical Provider, MD  cyclobenzaprine (AMRIX) 15 MG 24 hr capsule Take 1 capsule (15 mg total) by mouth daily as needed for muscle spasms. 04/24/14   Blanchie Dessert, MD  hydrOXYzine (ATARAX/VISTARIL) 25 MG tablet Take 1 tablet (25 mg total) by mouth every 6 (six) hours. 11/16/15   Noland Fordyce, PA-C  lisinopril (PRINIVIL,ZESTRIL) 20 MG tablet Take 20 mg by mouth daily.    Historical Provider, MD  oxyCODONE-acetaminophen (PERCOCET/ROXICET) 5-325 MG per tablet Take 1-2 tablets by mouth every 6 (six) hours as needed for severe pain. 04/24/14  Blanchie Dessert, MD  predniSONE (DELTASONE) 10 MG tablet Take 30 mg by mouth once.    Historical Provider, MD  predniSONE (DELTASONE) 10 MG tablet Take 1 tablet (10 mg total) by mouth daily with breakfast. 11/14/15   Sharion Balloon, FNP  predniSONE (DELTASONE) 20 MG tablet 3 tabs po day one, then 2 po daily x 4 days 11/16/15   Noland Fordyce, PA-C  triamcinolone cream (KENALOG) 0.5 % Apply 1 application topically 2 (two) times daily. 11/14/15   Sharion Balloon, FNP   Meds Ordered and Administered this Visit   Medications   triamcinolone acetonide (KENALOG-40) injection 60 mg (60 mg Intramuscular Given 11/16/15 0848)    BP 132/83 mmHg  Pulse 90  Temp(Src) 98.4 F (36.9 C) (Oral)  Ht 5\' 6"  (1.676 m)  Wt 259 lb 4 oz (117.595 kg)  BMI 41.86 kg/m2  SpO2 96%  LMP 11/12/2015 No data found.   Physical Exam  Constitutional: She is oriented to person, place, and time. She appears well-developed and well-nourished. No distress.  HENT:  Head: Normocephalic and atraumatic.  Mouth/Throat: Oropharynx is clear and moist.  Eyes: EOM are normal.  Neck: Normal range of motion.  Cardiovascular: Normal rate, regular rhythm and normal heart sounds.   Pulmonary/Chest: Effort normal and breath sounds normal. No respiratory distress. She has no wheezes. She has no rales.  Musculoskeletal: Normal range of motion.  Neurological: She is alert and oriented to person, place, and time.  Skin: Skin is warm and dry. Rash noted. She is not diaphoretic. There is erythema.  Diffuse erythematous papular rash, worse on Right forearm and Right lower back along waist line.   Psychiatric: She has a normal mood and affect. Her behavior is normal.  Nursing note and vitals reviewed.   ED Course  Procedures (including critical care time)  Labs Review Labs Reviewed - No data to display  Imaging Review No results found.   MDM   1. Pruritic erythematous rash    Pt c/o pruritic rash believed to be from laundry detergent.  No evidence of underlying infection or anaphylaxis.    Will increase dose of Prednisone. Tx in UC: Kenalog 60mg  IM  Rx: Prednisone 60mg  day 1 (starting tomorrow), 40mg  for 4 days (total of 5 days), atarax  F/u with PCP in 1 week if not improving, sooner if worsening. Patient verbalized understanding and agreement with treatment plan.     Noland Fordyce, PA-C 11/16/15 0900

## 2015-11-16 NOTE — Discharge Instructions (Signed)
Atarax (hydroxizine) is an antihistamine that can be taken to help with itching. This medication can cause drowsiness so do not drive or drink alcohol while taking.    You were given a shot of kenalog (a steroid) today to help with itching and swelling from a likely allergic reaction.  You have been prescribed 5 days of prednisone, an oral steroid.  You may start this medication tomorrow with breakfast.

## 2015-11-19 DIAGNOSIS — N3281 Overactive bladder: Secondary | ICD-10-CM | POA: Insufficient documentation

## 2015-11-19 DIAGNOSIS — F419 Anxiety disorder, unspecified: Secondary | ICD-10-CM | POA: Insufficient documentation

## 2015-11-19 DIAGNOSIS — I10 Essential (primary) hypertension: Secondary | ICD-10-CM | POA: Insufficient documentation

## 2015-11-19 DIAGNOSIS — M26609 Unspecified temporomandibular joint disorder, unspecified side: Secondary | ICD-10-CM | POA: Insufficient documentation

## 2015-11-19 DIAGNOSIS — M48061 Spinal stenosis, lumbar region without neurogenic claudication: Secondary | ICD-10-CM | POA: Insufficient documentation

## 2015-11-26 DIAGNOSIS — L509 Urticaria, unspecified: Secondary | ICD-10-CM | POA: Diagnosis not present

## 2015-11-27 DIAGNOSIS — L509 Urticaria, unspecified: Secondary | ICD-10-CM | POA: Diagnosis not present

## 2015-11-27 MED FILL — FEXOFENADINE HCL 180 MG TAB: 180 | 30 days supply | Qty: 30 | Fill #0

## 2015-11-27 MED FILL — raNITIdine HCL 300 MG TABS: 300 | 30 days supply | Qty: 30 | Fill #0

## 2015-12-04 DIAGNOSIS — L309 Dermatitis, unspecified: Secondary | ICD-10-CM | POA: Diagnosis not present

## 2015-12-04 DIAGNOSIS — L853 Xerosis cutis: Secondary | ICD-10-CM | POA: Diagnosis not present

## 2015-12-04 DIAGNOSIS — L503 Dermatographic urticaria: Secondary | ICD-10-CM | POA: Diagnosis not present

## 2015-12-04 MED FILL — HYDROCORTISONE 2.5% CREAM: 2.5 | 14 days supply | Qty: 60 | Fill #0

## 2015-12-17 ENCOUNTER — Other Ambulatory Visit (HOSPITAL_COMMUNITY): Payer: Self-pay | Admitting: Otolaryngology

## 2015-12-17 DIAGNOSIS — Z85818 Personal history of malignant neoplasm of other sites of lip, oral cavity, and pharynx: Secondary | ICD-10-CM

## 2015-12-18 ENCOUNTER — Ambulatory Visit (HOSPITAL_BASED_OUTPATIENT_CLINIC_OR_DEPARTMENT_OTHER)
Admission: RE | Admit: 2015-12-18 | Discharge: 2015-12-18 | Disposition: A | Discharge status: Home / Self Care | Payer: 59 | Source: Ambulatory Visit | Attending: Otolaryngology | Admitting: Otolaryngology

## 2015-12-18 ENCOUNTER — Encounter (HOSPITAL_BASED_OUTPATIENT_CLINIC_OR_DEPARTMENT_OTHER): Payer: Self-pay

## 2015-12-18 DIAGNOSIS — Z85819 Personal history of malignant neoplasm of unspecified site of lip, oral cavity, and pharynx: Secondary | ICD-10-CM | POA: Diagnosis not present

## 2015-12-18 DIAGNOSIS — Z85818 Personal history of malignant neoplasm of other sites of lip, oral cavity, and pharynx: Secondary | ICD-10-CM

## 2015-12-18 DIAGNOSIS — C07 Malignant neoplasm of parotid gland: Secondary | ICD-10-CM | POA: Diagnosis not present

## 2015-12-18 MED ORDER — IOPAMIDOL (ISOVUE-300) INJECTION 61%
100.0000 mL | Freq: Once | INTRAVENOUS | Status: AC | PRN
  Administered 2015-12-18: 75 mL via INTRAVENOUS

## 2015-12-20 ENCOUNTER — Ambulatory Visit (HOSPITAL_COMMUNITY): Payer: 59

## 2016-01-24 ENCOUNTER — Encounter: Payer: Self-pay | Admitting: Family Medicine

## 2016-01-24 ENCOUNTER — Ambulatory Visit (INDEPENDENT_AMBULATORY_CARE_PROVIDER_SITE_OTHER): Payer: 59 | Admitting: Family Medicine

## 2016-01-24 DIAGNOSIS — M79604 Pain in right leg: Secondary | ICD-10-CM | POA: Diagnosis not present

## 2016-01-24 MED FILL — MELOXICAM 15 MG TABLET: 15 | 30 days supply | Qty: 30 | Fill #2

## 2016-01-28 DIAGNOSIS — M79604 Pain in right leg: Secondary | ICD-10-CM | POA: Insufficient documentation

## 2016-01-28 NOTE — Progress Notes (Signed)
PCP: Rubie Maid, MD  Subjective:   HPI: Patient is a 40 y.o. female here for custom orthotics.  Patient comes in today for custom orthotics. Has prior history of lumbar radiculopathy s/p surgical intervention. She is getting cramps in right leg only especially as her shift at work goes on. Pain currently is 0/10. Uses compression socks which help. Pain is anterior right lower leg. Better with rest. No skin changes, numbness.  Past Medical History:  Diagnosis Date  . Bulging lumbar disc   . Cancer of parotid gland ([place]) 04/2013   left  . Depression   . History of MRSA infection "years ago"   axilla  . Hypertension    under control with med., has been on med. x 5 yr.    Current Outpatient Prescriptions on File Prior to Visit  Medication Sig Dispense Refill  . cholecalciferol (VITAMIN D) 1000 UNITS tablet Take 1,000 Units by mouth daily.    . citalopram (CELEXA) 20 MG tablet Take 30 mg by mouth daily.    Marland Kitchen lisinopril (PRINIVIL,ZESTRIL) 20 MG tablet Take 20 mg by mouth daily.    Marland Kitchen triamcinolone cream (KENALOG) 0.5 % Apply 1 application topically 2 (two) times daily. 30 g 0   No current facility-administered medications on file prior to visit.     Past Surgical History:  Procedure Laterality Date  . CESAREAN SECTION    . MICRODISCECTOMY LUMBAR  06/2012   L5  . PAROTIDECTOMY Left 05/09/2013   Procedure: LEFT SUPERFICIAL PAROTIDECTOMY;  Surgeon: Jodi Marble, MD;  Location: [facility];  Service: ENT;  Laterality: Left;  . PAROTIDECTOMY Left 05/16/2013   Procedure: COMPLETION PAROTIDECTOMY LEFT WITH FACIAL  NERVE PRESERVATION ;  Surgeon: Jodi Marble, MD;  Location: [facility] [place];  Service: ENT;  Laterality: Left;    Allergies  Allergen Reactions  . Keflex [Cephalexin] Rash  . Sulfa Antibiotics Rash    Social History   Social History  . Marital status: Married    Spouse name: N/A  . Number of children: N/A  . Years of education:  N/A   Occupational History  . Not on file.   Social History Main Topics  . Smoking status: Former Research scientist (life sciences)  . Smokeless tobacco: Never Used     Comment: quit smoking 2007  . Alcohol use No  . Drug use: No  . Sexual activity: Not on file   Other Topics Concern  . Not on file   Social History Narrative  . No narrative on file    No family history on file.  BP 130/87   Pulse 91   Ht 5\' 6"  (1.676 m)   Review of Systems: See HPI above.    Objective:  Physical Exam:  Gen: NAD, comfortable in exam room  Bilateral feet/ankles: Mod pronation.   No hallux valgus/rigidus.   No gross deformity, swelling, ecchymoses FROM No TTP currently (points to anterior tibial musculature where she gets cramping. Negative ant drawer and talar tilt.   Negative syndesmotic compression. Thompsons test negative. NV intact distally. Leg lengths equal.    Assessment & Plan:  1. Right lower leg cramping - may be related to prior back surgery, nerve impingement causing weakness and cramping into this leg especially with prolonged standing and walking.  We discussed home exercise program.  Custom orthotics made today which should help as well.  Consider physical therapy in future if she struggles.  Patient was fitted for a : standard, cushioned, semi-rigid orthotic. The orthotic was  heated and afterward the patient stood on the orthotic blank positioned on the orthotic stand. The patient was positioned in subtalar neutral position and 10 degrees of ankle dorsiflexion in a weight bearing stance. After completion of molding, a stable base was applied to the orthotic blank. The blank was ground to a stable position for weight bearing. Size: 9 blue swirl Base: blue med density eva Posting: none Additional orthotic padding: none Total prep time 45 minutes.

## 2016-01-28 NOTE — Assessment & Plan Note (Signed)
may be related to prior back surgery, nerve impingement causing weakness and cramping into this leg especially with prolonged standing and walking.  We discussed home exercise program.  Custom orthotics made today which should help as well.  Consider physical therapy in future if she struggles.  Patient was fitted for a : standard, cushioned, semi-rigid orthotic. The orthotic was heated and afterward the patient stood on the orthotic blank positioned on the orthotic stand. The patient was positioned in subtalar neutral position and 10 degrees of ankle dorsiflexion in a weight bearing stance. After completion of molding, a stable base was applied to the orthotic blank. The blank was ground to a stable position for weight bearing. Size: 9 blue swirl Base: blue med density eva Posting: none Additional orthotic padding: none Total prep time 45 minutes.

## 2016-01-31 DIAGNOSIS — M542 Cervicalgia: Secondary | ICD-10-CM | POA: Diagnosis not present

## 2016-01-31 DIAGNOSIS — Z85818 Personal history of malignant neoplasm of other sites of lip, oral cavity, and pharynx: Secondary | ICD-10-CM | POA: Diagnosis not present

## 2016-02-05 MED FILL — LISINOPRIL 20 MG TABLET: 20 | 30 days supply | Qty: 30 | Fill #0

## 2016-02-05 MED FILL — CITALOPRAM HBR 20 MG TABLET: 20 | 30 days supply | Qty: 45 | Fill #0

## 2016-02-26 DIAGNOSIS — H5213 Myopia, bilateral: Secondary | ICD-10-CM | POA: Diagnosis not present

## 2016-02-26 DIAGNOSIS — H52203 Unspecified astigmatism, bilateral: Secondary | ICD-10-CM | POA: Diagnosis not present

## 2016-02-29 DIAGNOSIS — M5137 Other intervertebral disc degeneration, lumbosacral region: Secondary | ICD-10-CM | POA: Diagnosis not present

## 2016-02-29 DIAGNOSIS — M4806 Spinal stenosis, lumbar region: Secondary | ICD-10-CM | POA: Diagnosis not present

## 2016-02-29 DIAGNOSIS — I10 Essential (primary) hypertension: Secondary | ICD-10-CM | POA: Diagnosis not present

## 2016-02-29 DIAGNOSIS — F419 Anxiety disorder, unspecified: Secondary | ICD-10-CM | POA: Diagnosis not present

## 2016-03-04 MED FILL — PHENTERMINE 37.5 MG CAPSULE: 37.5 | 30 days supply | Qty: 30 | Fill #0

## 2016-03-12 MED FILL — MELOXICAM 15 MG TABLET: 15 | 30 days supply | Qty: 30 | Fill #0

## 2016-03-20 ENCOUNTER — Telehealth: Payer: 59 | Admitting: Physician Assistant

## 2016-03-20 DIAGNOSIS — B349 Viral infection, unspecified: Secondary | ICD-10-CM

## 2016-03-20 DIAGNOSIS — B9789 Other viral agents as the cause of diseases classified elsewhere: Secondary | ICD-10-CM

## 2016-03-20 DIAGNOSIS — J329 Chronic sinusitis, unspecified: Secondary | ICD-10-CM | POA: Diagnosis not present

## 2016-03-20 MED ORDER — FLUTICASONE PROPIONATE 50 MCG/ACT NA SUSP: 2.0000 | Freq: Every day | NASAL | 6 refills | Status: DC

## 2016-03-20 NOTE — Progress Notes (Signed)

## 2016-04-03 MED FILL — LISINOPRIL 20 MG TABLET: 20 | 90 days supply | Qty: 90 | Fill #0

## 2016-04-03 MED FILL — CITALOPRAM HBR 20 MG TABLET: 20 | 30 days supply | Qty: 45 | Fill #0

## 2016-04-28 DIAGNOSIS — Z01419 Encounter for gynecological examination (general) (routine) without abnormal findings: Secondary | ICD-10-CM | POA: Diagnosis not present

## 2016-04-28 DIAGNOSIS — N898 Other specified noninflammatory disorders of vagina: Secondary | ICD-10-CM | POA: Diagnosis not present

## 2016-04-28 MED FILL — TINIDAZOLE 500 MG TABLET: 500 | 5 days supply | Qty: 10 | Fill #0

## 2016-04-29 DIAGNOSIS — H40053 Ocular hypertension, bilateral: Secondary | ICD-10-CM | POA: Diagnosis not present

## 2016-05-01 ENCOUNTER — Other Ambulatory Visit (HOSPITAL_BASED_OUTPATIENT_CLINIC_OR_DEPARTMENT_OTHER): Payer: Self-pay | Admitting: Obstetrics and Gynecology

## 2016-05-01 DIAGNOSIS — Z1231 Encounter for screening mammogram for malignant neoplasm of breast: Secondary | ICD-10-CM

## 2016-05-02 ENCOUNTER — Ambulatory Visit (HOSPITAL_BASED_OUTPATIENT_CLINIC_OR_DEPARTMENT_OTHER)
Admission: RE | Admit: 2016-05-02 | Discharge: 2016-05-02 | Disposition: A | Discharge status: Home / Self Care | Payer: 59 | Source: Ambulatory Visit | Attending: Obstetrics and Gynecology | Admitting: Obstetrics and Gynecology

## 2016-05-02 DIAGNOSIS — Z1231 Encounter for screening mammogram for malignant neoplasm of breast: Secondary | ICD-10-CM | POA: Diagnosis not present

## 2016-05-05 ENCOUNTER — Ambulatory Visit (INDEPENDENT_AMBULATORY_CARE_PROVIDER_SITE_OTHER): Payer: 59 | Admitting: Family Medicine

## 2016-05-05 ENCOUNTER — Encounter: Payer: Self-pay | Admitting: Family Medicine

## 2016-05-05 ENCOUNTER — Ambulatory Visit (HOSPITAL_BASED_OUTPATIENT_CLINIC_OR_DEPARTMENT_OTHER)
Admission: RE | Admit: 2016-05-05 | Discharge: 2016-05-05 | Disposition: A | Discharge status: Home / Self Care | Payer: 59 | Source: Ambulatory Visit | Attending: Family Medicine | Admitting: Family Medicine

## 2016-05-05 VITALS — BP 125/81 | HR 90 | Ht 66.0 in

## 2016-05-05 DIAGNOSIS — X58XXXA Exposure to other specified factors, initial encounter: Secondary | ICD-10-CM | POA: Diagnosis not present

## 2016-05-05 DIAGNOSIS — S99921A Unspecified injury of right foot, initial encounter: Secondary | ICD-10-CM | POA: Diagnosis not present

## 2016-05-05 DIAGNOSIS — M79671 Pain in right foot: Secondary | ICD-10-CM | POA: Diagnosis not present

## 2016-05-05 NOTE — Patient Instructions (Signed)
Your x-rays and ultrasound are reassuring. You have an intermetatarsal sprain (torn ligament). Icing 15 minutes at a time 3-4 times a day. Ibuprofen 600mg  three times a day with food OR aleve 2 tabs twice a day with food for pain and inflammation. Arch binder, cam walker if these feel more comfortable. Avoid barefoot walking, flat shoes as much as possible. I'd expect this to resolve over 2-4 weeks. Follow up with me as needed.

## 2016-05-07 DIAGNOSIS — S99921A Unspecified injury of right foot, initial encounter: Secondary | ICD-10-CM | POA: Insufficient documentation

## 2016-05-07 NOTE — Assessment & Plan Note (Signed)
independently reviewed radiographs and no evidence fracture.  Consistent with intermetatarsal ligament sprain.  Icing, ibuprofen.  Arch binder for support.  Avoid barefoot walking, flat shoes.  F/u prn.

## 2016-05-07 NOTE — Progress Notes (Signed)
PCP: Rubie Maid, MD  Subjective:   HPI: Patient is a 40 y.o. female here for right foot injury.  Patient reports on 11/5 she was wearing a small heeled shoe when she twisted her right foot. Immediate pain, some swelling lateral right foot. Has been icing, taking ibuprofen. Pain is 3/10, sharp, worse with walking. Is limping. No skin changes, numbness otherwise.  Past Medical History:  Diagnosis Date  . Bulging lumbar disc   . Cancer of parotid gland ([place]) 04/2013   left  . Depression   . History of MRSA infection "years ago"   axilla  . Hypertension    under control with med., has been on med. x 5 yr.    Current Outpatient Prescriptions on File Prior to Visit  Medication Sig Dispense Refill  . cholecalciferol (VITAMIN D) 1000 UNITS tablet Take 1,000 Units by mouth daily.    . citalopram (CELEXA) 20 MG tablet Take 30 mg by mouth daily.    . fluticasone (FLONASE) 50 MCG/ACT nasal spray Place 2 sprays into both nostrils daily. 16 g 6  . lisinopril (PRINIVIL,ZESTRIL) 20 MG tablet Take 20 mg by mouth daily.    . meloxicam (MOBIC) 15 MG tablet   2  . triamcinolone cream (KENALOG) 0.5 % Apply 1 application topically 2 (two) times daily. 30 g 0   No current facility-administered medications on file prior to visit.     Past Surgical History:  Procedure Laterality Date  . CESAREAN SECTION    . MICRODISCECTOMY LUMBAR  06/2012   L5  . PAROTIDECTOMY Left 05/09/2013   Procedure: LEFT SUPERFICIAL PAROTIDECTOMY;  Surgeon: Jodi Marble, MD;  Location: [place];  Service: ENT;  Laterality: Left;  . PAROTIDECTOMY Left 05/16/2013   Procedure: COMPLETION PAROTIDECTOMY LEFT WITH FACIAL  NERVE PRESERVATION ;  Surgeon: Jodi Marble, MD;  Location: [place];  Service: ENT;  Laterality: Left;    Allergies  Allergen Reactions  . Keflex [Cephalexin] Rash  . Sulfa Antibiotics Rash    Social History   Social History  . Marital status: Married   Spouse name: N/A  . Number of children: N/A  . Years of education: N/A   Occupational History  . Not on file.   Social History Main Topics  . Smoking status: Former Research scientist (life sciences)  . Smokeless tobacco: Never Used     Comment: quit smoking 2007  . Alcohol use No  . Drug use: No  . Sexual activity: Not on file   Other Topics Concern  . Not on file   Social History Narrative  . No narrative on file    No family history on file.  BP 125/81   Pulse 90   Ht 5\' 6"  (1.676 m)   LMP 04/21/2016   Review of Systems: See HPI above.     Objective:  Physical Exam:  Gen: NAD, comfortable in exam room  Right foot/ankle: No gross deformity, swelling, ecchymoses FROM with pain on external rotation. TTP 5th metatarsal base.  No other tenderness about foot or ankle. Negative ant drawer and talar tilt.   Negative syndesmotic compression. Thompsons test negative. NV intact distally.  Left foot/ankle: FROM without pain.  Assessment & Plan:  1. Right foot injury - independently reviewed radiographs and no evidence fracture.  Consistent with intermetatarsal ligament sprain.  Icing, ibuprofen.  Arch binder for support.  Avoid barefoot walking, flat shoes.  F/u prn.

## 2016-05-26 MED FILL — CITALOPRAM HBR 20 MG TABLET: 20 | 30 days supply | Qty: 45 | Fill #1

## 2016-06-11 DIAGNOSIS — N390 Urinary tract infection, site not specified: Secondary | ICD-10-CM | POA: Diagnosis not present

## 2016-06-11 DIAGNOSIS — Z85818 Personal history of malignant neoplasm of other sites of lip, oral cavity, and pharynx: Secondary | ICD-10-CM | POA: Diagnosis not present

## 2016-06-11 DIAGNOSIS — B029 Zoster without complications: Secondary | ICD-10-CM | POA: Diagnosis not present

## 2016-06-11 DIAGNOSIS — R319 Hematuria, unspecified: Secondary | ICD-10-CM | POA: Diagnosis not present

## 2016-06-11 MED FILL — CIPROFLOXACIN HCL 500 MG TA: 500 | 10 days supply | Qty: 20 | Fill #0

## 2016-06-11 MED FILL — FAMCICLOVIR 500 MG TABLET: 500 | 14 days supply | Qty: 42 | Fill #0

## 2016-06-12 DIAGNOSIS — Z202 Contact with and (suspected) exposure to infections with a predominantly sexual mode of transmission: Secondary | ICD-10-CM | POA: Diagnosis not present

## 2016-06-12 DIAGNOSIS — R300 Dysuria: Secondary | ICD-10-CM | POA: Diagnosis not present

## 2016-06-18 MED FILL — PHENTERMINE 37.5 MG CAPSULE: 37.5 | 30 days supply | Qty: 30 | Fill #1

## 2016-06-26 MED FILL — FLUCONAZOLE 150 MG TABLET: 150 | 2 days supply | Qty: 2 | Fill #0

## 2016-07-02 MED FILL — MELOXICAM 15 MG TABLET: 15 | 30 days supply | Qty: 30 | Fill #1

## 2016-07-02 MED FILL — CITALOPRAM HBR 20 MG TABLET: 20 | 30 days supply | Qty: 45 | Fill #2

## 2016-07-04 ENCOUNTER — Encounter (HOSPITAL_BASED_OUTPATIENT_CLINIC_OR_DEPARTMENT_OTHER): Payer: Self-pay | Admitting: Emergency Medicine

## 2016-07-04 ENCOUNTER — Emergency Department (HOSPITAL_BASED_OUTPATIENT_CLINIC_OR_DEPARTMENT_OTHER)
Admission: EM | Admit: 2016-07-04 | Discharge: 2016-07-05 | Disposition: A | Discharge status: Home / Self Care | Payer: 59 | Attending: Emergency Medicine | Admitting: Emergency Medicine

## 2016-07-04 DIAGNOSIS — Z85858 Personal history of malignant neoplasm of other endocrine glands: Secondary | ICD-10-CM | POA: Diagnosis not present

## 2016-07-04 DIAGNOSIS — T782XXA Anaphylactic shock, unspecified, initial encounter: Secondary | ICD-10-CM | POA: Diagnosis not present

## 2016-07-04 DIAGNOSIS — I10 Essential (primary) hypertension: Secondary | ICD-10-CM | POA: Insufficient documentation

## 2016-07-04 DIAGNOSIS — Z87891 Personal history of nicotine dependence: Secondary | ICD-10-CM | POA: Diagnosis not present

## 2016-07-04 DIAGNOSIS — T7840XA Allergy, unspecified, initial encounter: Secondary | ICD-10-CM | POA: Diagnosis present

## 2016-07-04 MED ORDER — DIPHENHYDRAMINE HCL 50 MG/ML IJ SOLN
25.0000 mg | Freq: Once | INTRAMUSCULAR | Status: AC
  Administered 2016-07-04: 25 mg via INTRAVENOUS
  Filled 2016-07-04: qty 1

## 2016-07-04 MED ORDER — METHYLPREDNISOLONE SODIUM SUCC 125 MG IJ SOLR
125.0000 mg | Freq: Once | INTRAMUSCULAR | Status: AC
  Administered 2016-07-04: 125 mg via INTRAVENOUS
  Filled 2016-07-04: qty 2

## 2016-07-04 MED ORDER — FAMOTIDINE IN NACL 20-0.9 MG/50ML-% IV SOLN
20.0000 mg | Freq: Once | INTRAVENOUS | Status: AC
  Administered 2016-07-04: 20 mg via INTRAVENOUS
  Filled 2016-07-04: qty 50

## 2016-07-04 NOTE — ED Provider Notes (Signed)
[facility] DEPT [facility] Provider Note   CSN: [id number] Arrival date & time: 07/04/16  2158  By signing my name below, I, Reola Mosher, attest that this documentation has been prepared under the direction and in the presence of Carlisle Cater, PA-C.  Electronically Signed: Reola Mosher, ED Scribe. 07/04/16. 10:28 PM.  History   Chief Complaint Chief Complaint  Patient presents with  . Allergic Reaction   The history is provided by the patient. No language interpreter was used.    HPI Comments: [name] [name] is a 41 y.o. female with a PMHx of HTN, who presents to the Emergency Department complaining of a sudden onset, worsening diffuse bodily rash beginning approximately one hour ago. Pt reports that she was at home tonight when she felt like she had to go to the bathroom, and upon reaching the bathroom she had a sudden pruritic sensation diffusely begin spreading across her body, with an accompanying rash appearing soon after. She additionally passed a bowel movement of watery diarrhea just after the onset of her rash. Pt states that she is now mildly nauseous and light-headed while in the ED. She reports that prior to the onset of her rash that she ate some soup and a grilled cheese sandwich, which she has tolerated before without complication. Pt attempted taking Benadryl at home with minimal relief of her symptoms. No known food allergies. No h/o similar reactions of this severity or otherwise. No recent illness/infections. She denies vomiting, sensation of throat swelling, trouble swallowing, or any other associated symptoms.   Past Medical History:  Diagnosis Date  . Bulging lumbar disc   . 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 Depression   . History of MRSA infection "years ago"   axilla  . Hypertension    under control with med., has been on med. x 5 yr.   Patient Active Problem List   Diagnosis Date Noted  . Right foot injury 05/07/2016  . Right leg  pain 01/28/2016  . Anxiety 11/19/2015  . Spinal stenosis of lumbar region without neurogenic claudication 11/19/2015  . TMJ (temporomandibular joint disorder) 11/19/2015  . OAB (overactive bladder) 11/19/2015  . Essential hypertension 11/19/2015  . 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 CESAREAN SECTION    . MICRODISCECTOMY LUMBAR  06/2012   L5  . PAROTIDECTOMY Left 05/09/2013   Procedure: LEFT SUPERFICIAL PAROTIDECTOMY;  Surgeon: Jodi Marble, MD;  Location: [facility];  Service: ENT;  Laterality: Left;  . PAROTIDECTOMY Left 05/16/2013   Procedure: COMPLETION PAROTIDECTOMY LEFT WITH FACIAL  NERVE PRESERVATION ;  Surgeon: Jodi Marble, MD;  Location: [facility];  Service: ENT;  Laterality: Left;   OB History    No data available     Home Medications    Prior to Admission medications   Medication Sig Start Date End Date Taking? Authorizing Provider  cholecalciferol (VITAMIN D) 1000 UNITS tablet Take 1,000 Units by mouth daily.    Historical Provider, MD  citalopram (CELEXA) 20 MG tablet Take 30 mg by mouth daily.    Historical Provider, MD  fluticasone (FLONASE) 50 MCG/ACT nasal spray Place 2 sprays into both nostrils daily. 03/20/16   Brunetta Jeans, PA-C  lisinopril (PRINIVIL,ZESTRIL) 20 MG tablet Take 20 mg by mouth daily.    Historical Provider, MD  meloxicam (MOBIC) 15 MG tablet  11/02/15  Historical Provider, MD  triamcinolone cream (KENALOG) 0.5 % Apply 1 application topically 2 (two) times daily. 11/14/15   Sharion Balloon, FNP   Family History History reviewed. No pertinent family history.  Social History Social History  Substance Use Topics  . Smoking status: Former Research scientist (life sciences)  . Smokeless tobacco: Never Used     Comment: quit smoking 2007  . Alcohol use No   Allergies   Keflex [cephalexin] and Sulfa antibiotics  Review of  Systems Review of Systems  Constitutional: Negative for fever.  HENT: Negative for facial swelling and trouble swallowing.   Eyes: Positive for redness.  Respiratory: Negative for shortness of breath, wheezing and stridor.   Cardiovascular: Negative for chest pain.  Gastrointestinal: Positive for diarrhea and nausea. Negative for vomiting.  Musculoskeletal: Negative for myalgias.  Skin: Positive for rash.  Neurological: Positive for light-headedness.  Psychiatric/Behavioral: Negative for confusion.  All other systems reviewed and are negative.  Physical Exam Updated Vital Signs BP 133/80 (BP Location: Right Arm)   Pulse 114   Temp 97.6 F (36.4 C) (Oral)   Resp 22   Ht 5\' 6"  (1.676 m)   Wt 259 lb (117.5 kg)   LMP 05/04/2016   SpO2 100%   BMI 41.80 kg/m   Physical Exam  Constitutional: She appears well-developed and well-nourished. No distress.  HENT:  Head: Normocephalic and atraumatic.  Mouth/Throat: Oropharynx is clear and moist.  No angioedema or tongue swelling.  Eyes: Conjunctivae are normal.  Slight scleral injection bilaterally.  Neck: Normal range of motion.  Cardiovascular: Normal rate and regular rhythm.   Pulmonary/Chest: Effort normal. No respiratory distress. She has no wheezes. She has no rales.  Abdominal: She exhibits no distension.  Musculoskeletal: Normal range of motion.  Neurological: She is alert.  Skin: No pallor.  Generalized urticaria noted.  Psychiatric: She has a normal mood and affect. Her behavior is normal.  Nursing note and vitals reviewed.  ED Treatments / Results  DIAGNOSTIC STUDIES: Oxygen Saturation is 100% on RA, normal by my interpretation.   COORDINATION OF CARE: 10:26 PM-Discussed next steps with pt. Pt verbalized understanding and is agreeable with the plan.   Procedures Procedures   Medications Ordered in ED Medications  diphenhydrAMINE (BENADRYL) injection 25 mg (25 mg Intravenous Given 07/04/16 2242)  famotidine  (PEPCID) IVPB 20 mg premix (0 mg Intravenous Stopped 07/04/16 2302)  methylPREDNISolone sodium succinate (SOLU-MEDROL) 125 mg/2 mL injection 125 mg (125 mg Intravenous Given 07/04/16 2242)    Initial Impression / Assessment and Plan / ED Course  I have reviewed the triage vital signs and the nursing notes.  Pertinent labs & imaging results that were available during my care of the patient were reviewed by me and considered in my medical decision making (see chart for details).  Clinical Course    12:48 AM patient doing much better with near resolution of urticaria after additional IV Benadryl, Pepcid, steroids. Will discharge to home on continued Benadryl and Pepcid. Prescription given for prednisone. Given EpiPen. Encouraged PCP follow-up.  Final Clinical Impressions(s) / ED Diagnoses   Final diagnoses:  Anaphylaxis, initial encounter   Patient with symptoms consistent with mild anaphylaxis, resolved with antihistamines and steroids. Discharged home with prescriptions as above.  New Prescriptions Discharge Medication List as of 07/05/2016 12:31 AM    START taking these medications   Details  EPINEPHrine 0.3 mg/0.3 mL IJ SOAJ injection Inject 0.3 mLs (0.3 mg total) into the muscle once., Starting Sat 07/05/2016, Print  predniSONE (DELTASONE) 20 MG tablet Take 2 tablets (40 mg total) by mouth daily., Starting Sat 07/05/2016, Print       I personally performed the services described in this documentation, which was scribed in my presence. The recorded information has been reviewed and is accurate.     Carlisle Cater, PA-C 07/05/16 [place], MD 07/05/16 [phone]

## 2016-07-04 NOTE — ED Triage Notes (Signed)
Patient reports that she went to the Restroom, felt like she was going to pass out. She started to rash throughout and tongue itching. Eyes are red and inflamed

## 2016-07-04 NOTE — ED Notes (Signed)
ED Provider at bedside. 

## 2016-07-05 MED ORDER — EPINEPHRINE 0.3 MG/0.3ML IJ SOAJ: 0.3000 mg | Freq: Once | INTRAMUSCULAR | 1 refills | Status: AC

## 2016-07-05 MED ORDER — PREDNISONE 20 MG PO TABS: 40.0000 mg | ORAL_TABLET | Freq: Every day | ORAL | 0 refills | Status: DC

## 2016-07-05 NOTE — Discharge Instructions (Signed)
Please read and follow all provided instructions.  Your diagnoses today include:  1. Anaphylaxis, initial encounter    Tests performed today include:  Vital signs. See below for your results today.   Medications prescribed:   Prednisone - steroid medicine   It is best to take this medication in the morning to prevent sleeping problems. If you are diabetic, monitor your blood sugar closely and stop taking Prednisone if blood sugar is over 300. Take with food to prevent stomach upset.    Epi-pen  Inject into thigh as directed if you have a severe reaction that causes throat swelling or any trouble breathing. Call 9-1-1 immediately if you use an Epi-pen. You should be evaluated at a hospital as soon as possible.     Benadryl (diphenhydramine) - antihistamine  You can find this medication over-the-counter.   DO NOT exceed:   50mg  Benadryl every 6 hours    Benadryl will make you drowsy. DO NOT drive or perform any activities that require you to be awake and alert if taking this.  Take any prescribed medications only as directed.  Home care instructions:   Follow any educational materials contained in this packet  Follow-up instructions: Please follow-up with your primary care provider in the next 3 days for further evaluation of your symptoms.   Return instructions:   Please return to the Emergency Department if you experience worsening symptoms.   Call 9-1-1 immediately if you have an allergic reaction that involves your lips, mouth, throat or if you have any difficulty breathing. This is a life-threatening emergency.   Please return if you have any other emergent concerns.  Additional Information:  Your vital signs today were: BP 113/67 (BP Location: Right Arm)    Pulse 92    Temp 97.6 F (36.4 C) (Oral)    Resp 16    Ht 5\' 6"  (1.676 m)    Wt 117.5 kg    LMP 05/04/2016    SpO2 98%    BMI 41.80 kg/m  If your blood pressure (BP) was elevated above 135/85 this visit,  please have this repeated by your doctor within one month. --------------

## 2016-07-07 MED FILL — EPINEPHRINE 0.3 MG AUTO-INJ: 0.3 | 30 days supply | Qty: 2 | Fill #0

## 2016-07-21 MED FILL — LISINOPRIL 20 MG TABLET: 20 | 90 days supply | Qty: 90 | Fill #1

## 2016-08-04 DIAGNOSIS — N911 Secondary amenorrhea: Secondary | ICD-10-CM | POA: Diagnosis not present

## 2016-08-04 MED FILL — PROGESTERONE 200 MG CAPSULE: 200 | 7 days supply | Qty: 14 | Fill #0

## 2016-08-18 DIAGNOSIS — J301 Allergic rhinitis due to pollen: Secondary | ICD-10-CM | POA: Diagnosis not present

## 2016-08-18 DIAGNOSIS — J3081 Allergic rhinitis due to animal (cat) (dog) hair and dander: Secondary | ICD-10-CM | POA: Diagnosis not present

## 2016-08-18 DIAGNOSIS — J3089 Other allergic rhinitis: Secondary | ICD-10-CM | POA: Diagnosis not present

## 2016-08-18 DIAGNOSIS — Z87892 Personal history of anaphylaxis: Secondary | ICD-10-CM | POA: Diagnosis not present

## 2016-08-29 MED FILL — CITALOPRAM HBR 20 MG TABLET: 20 | 30 days supply | Qty: 45 | Fill #3

## 2016-08-29 MED FILL — MELOXICAM 15 MG TABLET: 15 | 30 days supply | Qty: 30 | Fill #2

## 2016-09-01 DIAGNOSIS — Z111 Encounter for screening for respiratory tuberculosis: Secondary | ICD-10-CM | POA: Diagnosis not present

## 2016-09-01 DIAGNOSIS — I10 Essential (primary) hypertension: Secondary | ICD-10-CM | POA: Diagnosis not present

## 2016-09-01 MED FILL — TOPIRAMATE 100 MG TABLET: 100 | 30 days supply | Qty: 30 | Fill #0

## 2016-09-03 MED FILL — PHENTERMINE 37.5 MG CAPSULE: 37.5 | 30 days supply | Qty: 30 | Fill #0

## 2016-09-04 MED FILL — TOPIRAMATE 25 MG TABLET: 25 | 30 days supply | Qty: 120 | Fill #0

## 2016-09-26 MED FILL — MELOXICAM 15 MG TABLET: 15 | 30 days supply | Qty: 30 | Fill #3

## 2016-10-06 MED FILL — PHENTERMINE 37.5 MG CAPSULE: 37.5 | 30 days supply | Qty: 30 | Fill #1

## 2016-10-23 MED FILL — MELOXICAM 15 MG TABLET: 15 | 30 days supply | Qty: 30 | Fill #4

## 2016-10-23 MED FILL — LISINOPRIL 20 MG TABLET: 20 | 90 days supply | Qty: 90 | Fill #2

## 2016-10-23 MED FILL — CITALOPRAM HBR 20 MG TABLET: 20 | 30 days supply | Qty: 45 | Fill #4

## 2016-12-15 MED FILL — TOPIRAMATE 25 MG TABLET: 25 | 30 days supply | Qty: 120 | Fill #1

## 2016-12-15 MED FILL — MELOXICAM 15 MG TABLET: 15 | 30 days supply | Qty: 30 | Fill #5

## 2016-12-15 MED FILL — PHENTERMINE 37.5 MG CAPSULE: 37.5 | 30 days supply | Qty: 30 | Fill #2

## 2016-12-15 MED FILL — CITALOPRAM HBR 20 MG TABLET: 20 | 30 days supply | Qty: 45 | Fill #5

## 2017-01-22 DIAGNOSIS — Z85818 Personal history of malignant neoplasm of other sites of lip, oral cavity, and pharynx: Secondary | ICD-10-CM | POA: Diagnosis not present

## 2017-01-27 MED FILL — LISINOPRIL 20 MG TABLET: 20 | 90 days supply | Qty: 90 | Fill #3

## 2017-01-27 MED FILL — CITALOPRAM HBR 20 MG TABLET: 20 | 30 days supply | Qty: 45 | Fill #6

## 2017-01-27 MED FILL — MELOXICAM 15 MG TABLET: 15 | 30 days supply | Qty: 30 | Fill #6

## 2017-03-19 MED FILL — MELOXICAM 15 MG TABLET: 15 | 30 days supply | Qty: 30 | Fill #0

## 2017-03-19 MED FILL — CITALOPRAM HBR 20 MG TABLET: 20 | 30 days supply | Qty: 45 | Fill #0

## 2017-04-27 DIAGNOSIS — M5137 Other intervertebral disc degeneration, lumbosacral region: Secondary | ICD-10-CM | POA: Diagnosis not present

## 2017-04-27 DIAGNOSIS — M48061 Spinal stenosis, lumbar region without neurogenic claudication: Secondary | ICD-10-CM | POA: Diagnosis not present

## 2017-04-27 DIAGNOSIS — I10 Essential (primary) hypertension: Secondary | ICD-10-CM | POA: Diagnosis not present

## 2017-04-27 DIAGNOSIS — F419 Anxiety disorder, unspecified: Secondary | ICD-10-CM | POA: Diagnosis not present

## 2017-05-05 MED FILL — MELOXICAM 7.5 MG TABLET: 7.5 | 90 days supply | Qty: 90 | Fill #0

## 2017-05-05 MED FILL — LISINOPRIL 20 MG TABLET: 20 | 90 days supply | Qty: 90 | Fill #0

## 2017-05-05 MED FILL — CITALOPRAM HBR 20 MG TABLET: 20 | 90 days supply | Qty: 90 | Fill #0

## 2017-05-22 ENCOUNTER — Encounter (HOSPITAL_BASED_OUTPATIENT_CLINIC_OR_DEPARTMENT_OTHER): Payer: Self-pay

## 2017-05-22 ENCOUNTER — Ambulatory Visit (HOSPITAL_BASED_OUTPATIENT_CLINIC_OR_DEPARTMENT_OTHER)
Admission: RE | Admit: 2017-05-22 | Discharge: 2017-05-22 | Disposition: A | Discharge status: Home / Self Care | Payer: 59 | Source: Ambulatory Visit | Attending: Obstetrics and Gynecology | Admitting: Obstetrics and Gynecology

## 2017-05-22 ENCOUNTER — Other Ambulatory Visit (HOSPITAL_BASED_OUTPATIENT_CLINIC_OR_DEPARTMENT_OTHER): Payer: Self-pay | Admitting: Obstetrics and Gynecology

## 2017-05-22 DIAGNOSIS — Z1231 Encounter for screening mammogram for malignant neoplasm of breast: Secondary | ICD-10-CM | POA: Diagnosis not present

## 2017-05-22 DIAGNOSIS — Z1239 Encounter for other screening for malignant neoplasm of breast: Secondary | ICD-10-CM

## 2017-06-18 DIAGNOSIS — H52203 Unspecified astigmatism, bilateral: Secondary | ICD-10-CM | POA: Diagnosis not present

## 2017-06-18 DIAGNOSIS — H5213 Myopia, bilateral: Secondary | ICD-10-CM | POA: Diagnosis not present

## 2017-07-13 MED FILL — METHYLPREDNISOLONE 4 MG TAB: 4 | 6 days supply | Qty: 21 | Fill #0

## 2017-07-14 ENCOUNTER — Encounter: Payer: Self-pay | Admitting: Family Medicine

## 2017-07-14 ENCOUNTER — Ambulatory Visit (INDEPENDENT_AMBULATORY_CARE_PROVIDER_SITE_OTHER): Payer: 59 | Admitting: Family Medicine

## 2017-07-14 DIAGNOSIS — M722 Plantar fascial fibromatosis: Secondary | ICD-10-CM

## 2017-07-14 NOTE — Patient Instructions (Signed)
You have plantar fasciitis Take tylenol and/or aleve as needed for pain  Plantar fascia stretch for 20-30 seconds (do 3 of these) in morning Lowering/raise on a step exercises 3 x 10 once or twice a day - this is very important for long term recovery. Can add heel walks, toe walks forward and backward as well Ice heel for 15 minutes as needed. Avoid flat shoes/barefoot walking as much as possible. Arch straps have been shown to help with pain - wear when up and walking around. Try our green insoles with scaphoid pads - if you don't tolerate these because of your back only do the scaphoid pads (we can help you put these in the right spot in your shoes if that's the case). Night splint when sleeping. Steroid injection is a consideration for short term pain relief if you are struggling. Physical therapy is also an option. Follow up with me in 6 weeks.

## 2017-07-15 ENCOUNTER — Encounter: Payer: Self-pay | Admitting: Family Medicine

## 2017-07-15 DIAGNOSIS — M722 Plantar fascial fibromatosis: Secondary | ICD-10-CM | POA: Insufficient documentation

## 2017-07-15 NOTE — Assessment & Plan Note (Signed)
reassured patient.  Shown home exercises and stretches to do daily.  Sports insoles with scaphoid pads if she can tolerate these with her prior back problems.  Night splints.  Arch binders.  Tylenol and/or aleve as needed.  Consider injection, physical therapy.  Avoid flat shoes and barefoot walking.  F/u in 6 weeks.

## 2017-07-15 NOTE — Progress Notes (Signed)
PCP: Rubie Maid, MD  Subjective:   HPI: Patient is a 42 y.o. female here for left heel pain.  Patient reports she's had about 2 months of plantar left heel pain. No injury or trauma. Works long shifts as Marine scientist in ED and pain worse by end of day with this. Has to stand, walk on tiptoes at times due to pain. Pain level 1/10, more dull. Has been taking ibuprofen and wrapping this. Slight swelling. No skin changes, numbness.  Past Medical History:  Diagnosis Date  . Bulging lumbar disc   . 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 Depression   . History of MRSA infection "years ago"   axilla  . Hypertension    under control with med., has been on med. x 5 yr.    Current Outpatient Medications on File Prior to Visit  Medication Sig Dispense Refill  . cholecalciferol (VITAMIN D) 1000 UNITS tablet Take 1,000 Units by mouth daily.    . citalopram (CELEXA) 20 MG tablet Take 30 mg by mouth daily.    . fluticasone (FLONASE) 50 MCG/ACT nasal spray Place 2 sprays into both nostrils daily. 16 g 6  . lisinopril (PRINIVIL,ZESTRIL) 20 MG tablet Take 20 mg by mouth daily.    . meloxicam (MOBIC) 15 MG tablet   2  . predniSONE (DELTASONE) 20 MG tablet Take 2 tablets (40 mg total) by mouth daily. 10 tablet 0  . triamcinolone cream (KENALOG) 0.5 % Apply 1 application topically 2 (two) times daily. 30 g 0   No current facility-administered medications on file prior to visit.     Past Surgical History:  Procedure Laterality Date  . CESAREAN SECTION    . MICRODISCECTOMY LUMBAR  06/2012   L5  . PAROTIDECTOMY Left 05/09/2013   Procedure: LEFT SUPERFICIAL PAROTIDECTOMY;  Surgeon: Jodi Marble, MD;  Location: [facility];  Service: ENT;  Laterality: Left;  . PAROTIDECTOMY Left 05/16/2013   Procedure: COMPLETION PAROTIDECTOMY LEFT WITH FACIAL  NERVE PRESERVATION ;  Surgeon: Jodi Marble, MD;  Location: [facility];  Service: ENT;  Laterality: Left;     Allergies  Allergen Reactions  . Keflex [Cephalexin] Rash  . Sulfa Antibiotics Rash    Social History   Socioeconomic History  . Marital status: Married    Spouse name: Not on file  . Number of children: Not on file  . Years of education: Not on file  . Highest education level: Not on file  Social Needs  . Financial resource strain: Not on file  . Food insecurity - worry: Not on file  . Food insecurity - inability: Not on file  . Transportation needs - medical: Not on file  . Transportation needs - non-medical: Not on file  Occupational History  . Not on file  Tobacco Use  . Smoking status: Former Research scientist (life sciences)  . Smokeless tobacco: Never Used  . Tobacco comment: quit smoking 2007  Substance and Sexual Activity  . Alcohol use: No  . Drug use: No  . Sexual activity: Not on file  Other Topics Concern  . Not on file  Social History Narrative  . Not on file    History reviewed. No pertinent family history.  BP 133/89   Pulse 100   Ht 5\' 6"  (1.676 m)   BMI 41.80 kg/m   Review of Systems: See HPI above.     Objective:  Physical Exam:  Gen: NAD, comfortable in exam room  Left foot/ankle: No  gross deformity, swelling, ecchymoses.  Mod pronation. FROM with 5/5 strength without pain. TTP minimally anterior portion of calcaneus at PF insertion.  No other tenderness. Negative ant drawer and talar tilt.   Negative syndesmotic compression. Thompsons test negative. Negative calcaneal squeeze. NV intact distally.  Right foot/ankle: No gross deformity, swelling, ecchymoses FROM TTP Negative ant drawer and talar tilt.   Negative syndesmotic compression. Thompsons test negative. NV intact distally.   Assessment & Plan:  1. Left plantar fasciitis - reassured patient.  Shown home exercises and stretches to do daily.  Sports insoles with scaphoid pads if she can tolerate these with her prior back problems.  Night splints.  Arch binders.  Tylenol and/or aleve as  needed.  Consider injection, physical therapy.  Avoid flat shoes and barefoot walking.  F/u in 6 weeks.

## 2017-07-24 MED FILL — AMOX-CLAV 875-125 MG TABLET: 875-125 | 7 days supply | Qty: 14 | Fill #0

## 2017-08-06 DIAGNOSIS — Z01411 Encounter for gynecological examination (general) (routine) with abnormal findings: Secondary | ICD-10-CM | POA: Diagnosis not present

## 2017-08-06 DIAGNOSIS — Z79899 Other long term (current) drug therapy: Secondary | ICD-10-CM | POA: Diagnosis not present

## 2017-08-06 DIAGNOSIS — N393 Stress incontinence (female) (male): Secondary | ICD-10-CM | POA: Diagnosis not present

## 2017-08-06 DIAGNOSIS — I10 Essential (primary) hypertension: Secondary | ICD-10-CM | POA: Diagnosis not present

## 2017-08-06 DIAGNOSIS — K649 Unspecified hemorrhoids: Secondary | ICD-10-CM | POA: Diagnosis not present

## 2017-08-06 DIAGNOSIS — Z01419 Encounter for gynecological examination (general) (routine) without abnormal findings: Secondary | ICD-10-CM | POA: Diagnosis not present

## 2017-08-06 DIAGNOSIS — Z1151 Encounter for screening for human papillomavirus (HPV): Secondary | ICD-10-CM | POA: Diagnosis not present

## 2017-08-06 DIAGNOSIS — Z803 Family history of malignant neoplasm of breast: Secondary | ICD-10-CM | POA: Diagnosis not present

## 2017-08-06 DIAGNOSIS — K921 Melena: Secondary | ICD-10-CM | POA: Diagnosis not present

## 2017-08-06 DIAGNOSIS — N3281 Overactive bladder: Secondary | ICD-10-CM | POA: Diagnosis not present

## 2017-08-10 DIAGNOSIS — M6702 Short Achilles tendon (acquired), left ankle: Secondary | ICD-10-CM | POA: Diagnosis not present

## 2017-08-10 DIAGNOSIS — M722 Plantar fascial fibromatosis: Secondary | ICD-10-CM | POA: Diagnosis not present

## 2017-08-10 MED FILL — MELOXICAM 7.5 MG TABLET: 7.5 | 90 days supply | Qty: 90 | Fill #1

## 2017-08-10 MED FILL — CITALOPRAM HBR 20 MG TABLET: 20 | 90 days supply | Qty: 90 | Fill #1

## 2017-08-10 MED FILL — LISINOPRIL 20 MG TABLET: 20 | 90 days supply | Qty: 90 | Fill #1

## 2017-08-11 DIAGNOSIS — I10 Essential (primary) hypertension: Secondary | ICD-10-CM | POA: Diagnosis not present

## 2017-08-11 DIAGNOSIS — Z0000 Encounter for general adult medical examination without abnormal findings: Secondary | ICD-10-CM | POA: Diagnosis not present

## 2017-08-13 MED FILL — OXYBUTYNIN CL ER 10 MG TAB: 10 | 30 days supply | Qty: 30 | Fill #0

## 2017-08-25 MED FILL — MYRBETRIQ ER 25 MG TABLET: 25 | 30 days supply | Qty: 30 | Fill #0

## 2017-08-31 DIAGNOSIS — M722 Plantar fascial fibromatosis: Secondary | ICD-10-CM | POA: Diagnosis not present

## 2017-08-31 DIAGNOSIS — M6702 Short Achilles tendon (acquired), left ankle: Secondary | ICD-10-CM | POA: Diagnosis not present

## 2017-09-18 DIAGNOSIS — M79671 Pain in right foot: Secondary | ICD-10-CM | POA: Diagnosis not present

## 2017-09-18 DIAGNOSIS — M2580 Other specified joint disorders, unspecified joint: Secondary | ICD-10-CM | POA: Diagnosis not present

## 2017-09-18 DIAGNOSIS — M722 Plantar fascial fibromatosis: Secondary | ICD-10-CM | POA: Diagnosis not present

## 2017-10-20 MED FILL — FLUCONAZOLE 200 MG TABLET: 200 | 5 days supply | Qty: 3 | Fill #0

## 2017-10-22 DIAGNOSIS — R7309 Other abnormal glucose: Secondary | ICD-10-CM | POA: Diagnosis not present

## 2017-11-18 DIAGNOSIS — R300 Dysuria: Secondary | ICD-10-CM | POA: Diagnosis not present

## 2017-11-18 DIAGNOSIS — N898 Other specified noninflammatory disorders of vagina: Secondary | ICD-10-CM | POA: Diagnosis not present

## 2017-11-18 MED FILL — LISINOPRIL 20 MG TABLET: 20 | 90 days supply | Qty: 90 | Fill #2

## 2017-11-18 MED FILL — CITALOPRAM HBR 20 MG TABLET: 20 | 90 days supply | Qty: 90 | Fill #2

## 2017-11-18 MED FILL — MELOXICAM 7.5 MG TABLET: 7.5 | 90 days supply | Qty: 90 | Fill #2

## 2017-12-07 ENCOUNTER — Ambulatory Visit (INDEPENDENT_AMBULATORY_CARE_PROVIDER_SITE_OTHER): Payer: 59 | Admitting: Family Medicine

## 2017-12-07 ENCOUNTER — Encounter: Payer: Self-pay | Admitting: Family Medicine

## 2017-12-07 DIAGNOSIS — R2242 Localized swelling, mass and lump, left lower limb: Secondary | ICD-10-CM

## 2017-12-07 NOTE — Patient Instructions (Signed)
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 There are no concerning features of this. If you don't want [name] [name] to cut this out we will refer you to general surgery for this.

## 2017-12-08 ENCOUNTER — Encounter: Payer: Self-pay | Admitting: Family Medicine

## 2017-12-08 DIAGNOSIS — R2242 Localized swelling, mass and lump, left lower limb: Secondary | ICD-10-CM | POA: Insufficient documentation

## 2017-12-08 NOTE — Progress Notes (Signed)
PCP: Rubie Maid, MD  Subjective:   HPI: Patient is a 42 y.o. female here for left hip mass.  Patient denies known trauma or injury. Over past week she noticed a mass anterolateral left hip. No symptoms associated with this. No rash, redness, fever, GI complaints. She had a parotid tumor removed previously that was cancerous. No numbness, other complaints.  Past Medical History:  Diagnosis Date  . Bulging lumbar disc   . 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 Depression   . History of MRSA infection "years ago"   axilla  . Hypertension    under control with med., has been on med. x 5 yr.    Current Outpatient Medications on File Prior to Visit  Medication Sig Dispense Refill  . cholecalciferol (VITAMIN D) 1000 UNITS tablet Take 1,000 Units by mouth daily.    . citalopram (CELEXA) 20 MG tablet Take 30 mg by mouth daily.    . fluticasone (FLONASE) 50 MCG/ACT nasal spray Place 2 sprays into both nostrils daily. 16 g 6  . lisinopril (PRINIVIL,ZESTRIL) 20 MG tablet Take 20 mg by mouth daily.    . meloxicam (MOBIC) 7.5 MG tablet   3   No current facility-administered medications on file prior to visit.     Past Surgical History:  Procedure Laterality Date  . CESAREAN SECTION    . MICRODISCECTOMY LUMBAR  06/2012   L5  . PAROTIDECTOMY Left 05/09/2013   Procedure: LEFT SUPERFICIAL PAROTIDECTOMY;  Surgeon: Jodi Marble, MD;  Location: [facility];  Service: ENT;  Laterality: Left;  . PAROTIDECTOMY Left 05/16/2013   Procedure: COMPLETION PAROTIDECTOMY LEFT WITH FACIAL  NERVE PRESERVATION ;  Surgeon: Jodi Marble, MD;  Location: [facility];  Service: ENT;  Laterality: Left;    Allergies  Allergen Reactions  . Keflex [Cephalexin] Rash  . Sulfa Antibiotics Rash    Social History   Socioeconomic History  . Marital status: Married    Spouse name: Not on file  . Number of children: Not on file  . Years of education: Not on file   . Highest education level: Not on file  Occupational History  . Not on file  Social Needs  . Financial resource strain: Not on file  . Food insecurity:    Worry: Not on file    Inability: Not on file  . Transportation needs:    Medical: Not on file    Non-medical: Not on file  Tobacco Use  . Smoking status: Former Research scientist (life sciences)  . Smokeless tobacco: Never Used  . Tobacco comment: quit smoking 2007  Substance and Sexual Activity  . Alcohol use: No  . Drug use: No  . Sexual activity: Not on file  Lifestyle  . Physical activity:    Days per week: Not on file    Minutes per session: Not on file  . Stress: Not on file  Relationships  . Social connections:    Talks on phone: Not on file    Gets together: Not on file    Attends religious service: Not on file    Active member of club or organization: Not on file    Attends meetings of clubs or organizations: Not on file    Relationship status: Not on file  . Intimate partner violence:    Fear of current or ex partner: Not on file    Emotionally abused: Not on file    Physically abused: Not on file  Forced sexual activity: Not on file  Other Topics Concern  . Not on file  Social History Narrative  . Not on file    History reviewed. No pertinent family history.  BP 132/82   Pulse 81   Ht 5\' 6"  (1.676 m)   Wt 260 lb (117.9 kg)   BMI 41.97 kg/m   Review of Systems: See HPI above.     Objective:  Physical Exam:  Gen: NAD, comfortable in exam room  Left hip: Visible mobile soft tissue mass anterolaterally above trochanter and lateral to inguinal area.  No change with movements.  No overlying redness, break in skin, other abnormalities. FROM with 5/5 strength of hip and no pain. No tenderness to palpation. NVI distally.  MSK u/s left hip:  Homogeneous mass identified without calcifications, necrotic features, or vascularity in area in question measuring 11.96cm in longest diameter, 3.95cm deep.  No pain on  compression.   Assessment & Plan:  1. 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  Will refer to general surgery to discuss surgical removal.  Can take tylenol for any soreness but she is asymptomatic at present time.  I don't think this is related to her history of parotid cancer.

## 2017-12-08 NOTE — Addendum Note (Signed)
Addended by: Sherrie George F on: 12/08/2017 01:28 PM   Modules accepted: Orders

## 2017-12-08 NOTE — Assessment & Plan Note (Signed)
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  Will refer to general surgery to discuss surgical removal.  Can take tylenol for any soreness but she is asymptomatic at present time.  I don't think this is related to her history of parotid cancer.

## 2018-01-06 DIAGNOSIS — D1724 Benign lipomatous neoplasm of skin and subcutaneous tissue of left leg: Secondary | ICD-10-CM | POA: Diagnosis not present

## 2018-01-12 MED FILL — FLUCONAZOLE 200 MG TABLET: 200 | 6 days supply | Qty: 3 | Fill #0

## 2018-01-28 DIAGNOSIS — Z85818 Personal history of malignant neoplasm of other sites of lip, oral cavity, and pharynx: Secondary | ICD-10-CM | POA: Diagnosis not present

## 2018-02-16 MED FILL — LISINOPRIL 20 MG TABLET: 20 | 90 days supply | Qty: 90 | Fill #3

## 2018-02-16 MED FILL — CITALOPRAM HBR 20 MG TABLET: 20 | 90 days supply | Qty: 90 | Fill #3

## 2018-02-17 ENCOUNTER — Telehealth: Payer: 59 | Admitting: Nurse Practitioner

## 2018-02-17 DIAGNOSIS — L02419 Cutaneous abscess of limb, unspecified: Secondary | ICD-10-CM | POA: Diagnosis not present

## 2018-02-17 MED ORDER — DOXYCYCLINE HYCLATE 100 MG PO TABS: 100.0000 mg | ORAL_TABLET | Freq: Two times a day (BID) | ORAL | 0 refills | Status: DC

## 2018-02-17 MED FILL — DOXYCYCLINE HYCLATE 100 MG: 100 | 10 days supply | Qty: 20 | Fill #0

## 2018-02-17 NOTE — Progress Notes (Signed)
E Visit for Rash  We are sorry that you are not feeling well. Here is how we plan to help!    Based upon what you have shared with me it looks like you have a bacterial follicultits.  Folliculitis is inflammation of the hair follicles that can be caused by a superficial infection of the skin and is treated with an antibiotic. I have prescribed: and doxycycline 100mg  bid  per day for 10 days      HOME CARE:   Take cool showers and avoid direct sunlight.  Apply cool compress or wet dressings.  Take a bath in an oatmeal bath.  Sprinkle content of one Aveeno packet under running faucet with comfortably warm water.  Bathe for 15-20 minutes, 1-2 times daily.  Pat dry with a towel. Do not rub the rash.  Use hydrocortisone cream.  Take an antihistamine like Benadryl for widespread rashes that itch.  The adult dose of Benadryl is 25-50 mg by mouth 4 times daily.  Caution:  This type of medication may cause sleepiness.  Do not drink alcohol, drive, or operate dangerous machinery while taking antihistamines.  Do not take these medications if you have prostate enlargement.  Read package instructions thoroughly on all medications that you take.  GET HELP RIGHT AWAY IF:   Symptoms don't go away after treatment.  Severe itching that persists.  If you rash spreads or swells.  If you rash begins to smell.  If it blisters and opens or develops a yellow-brown crust.  You develop a fever.  You have a sore throat.  You become short of breath.  MAKE SURE YOU:  Understand these instructions. Will watch your condition. Will get help right away if you are not doing well or get worse.  Thank you for choosing an e-visit. Your e-visit answers were reviewed by a board certified advanced clinical practitioner to complete your personal care plan. Depending upon the condition, your plan could have included both over the counter or prescription medications. Please review your pharmacy choice. Be  sure that the pharmacy you have chosen is open so that you can pick up your prescription now.  If there is a problem you may message your provider in [place] to have the prescription routed to another pharmacy. Your safety is important to [place]. If you have drug allergies check your prescription carefully.  For the next 24 hours, you can use MyChart to ask questions about today's visit, request a non-urgent call back, or ask for a work or school excuse from your e-visit provider. You will get an email in the next two days asking about your experience. I hope that your e-visit has been valuable and will speed your recovery.

## 2018-02-19 ENCOUNTER — Ambulatory Visit: Payer: Self-pay | Admitting: General Surgery

## 2018-02-19 NOTE — H&P (Signed)
Judd Gaudier Documented: 01/06/2018 8:56 AM Location: [facility] Patient #: [id number] DOB: Dec 24, 1975 Married / Language: Cleophus Molt / Race: White Female   History of Present Illness Randall Hiss M. Ryzen Deady MD; 01/06/2018 11:05 AM) The patient is a 42 year old female who presents with a soft tissue mass. She is referred by Dr Karlton Lemon for left hip soft tissue mass. She states that she noticed it about 6 weeks ago. It causes no pain or discomfort. She denies any trauma to the area. She denies any skin changes. She denies any weight change. She denies any numbness or tingling in her left leg or weakness. She denies any other soft tissue lumps or bumps. 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 She denies any lymphadenopathy. Because she has had a prior parotid tumor she is always a little bit concerned when a new soft tissue mass shows up. She denies any fevers or chills. 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 The referring physician did a bedside ultrasound showing a homogeneous mass without calcification, necrotic features or vascularity.   Problem List/Past Medical Randall Hiss M. 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 Caffeine use  Carbonated beverages, Coffee, Tea. No drug use  Tobacco use  Former smoker.  Family History Sabino Gasser; 01/06/2018 8:57 AM) Breast Cancer  Mother. Diabetes Mellitus  Mother. Hypertension  Mother. Migraine Headache  Mother. Ovarian Cancer   Mother.  Pregnancy / Birth History Sabino Gasser; 01/06/2018 8:57 AM) Age at menarche  41 years. Gravida  2 Length (months) of breastfeeding  7-12 Maternal age  46-25 Para  2 Regular periods   Other Problems Randall Hiss M. Redmond Pulling, MD; 01/06/2018 11:06 AM) Back Pain  Bladder Problems  Cancer  Depression  High blood pressure     Review of Systems Randall Hiss M. Eleonora Peeler MD; 01/06/2018 11:02 AM) General Not Present- Appetite Loss, Chills, Fatigue, Fever, Night Sweats, Weight Gain and Weight Loss. Skin Not Present- Change in Wart/Mole, Dryness, Hives, Jaundice, New Lesions, Non-Healing Wounds, Rash and Ulcer. HEENT Present- Seasonal Allergies and Wears glasses/contact lenses. Not Present- Earache, Hearing Loss, Hoarseness, Nose Bleed, Oral Ulcers, Ringing in the Ears, Sinus Pain, Sore Throat, Visual Disturbances and Yellow Eyes. Respiratory Not Present- Bloody sputum, Chronic Cough, Difficulty Breathing, Snoring and Wheezing. Breast Not Present- Breast Mass, Breast Pain, Nipple Discharge and Skin Changes. Cardiovascular Not Present- Chest Pain, Difficulty Breathing Lying Down, Leg Cramps, Palpitations, Rapid Heart Rate, Shortness of Breath and Swelling of Extremities. Gastrointestinal Not Present- Abdominal Pain, Bloating, Bloody Stool, Change in Bowel Habits, Chronic diarrhea, Constipation, Difficulty Swallowing, Excessive gas, Gets full quickly at meals, Hemorrhoids, Indigestion, Nausea, Rectal Pain and Vomiting. Female Genitourinary Present- Frequency. Not Present- Nocturia, Painful Urination, Pelvic Pain and Urgency. Musculoskeletal Present- Back Pain. Not Present- Joint Pain, Joint Stiffness, Muscle Pain, Muscle Weakness and Swelling of Extremities. Neurological Not Present- Decreased Memory, Fainting, Headaches, Numbness, Seizures, Tingling, Tremor, Trouble walking and Weakness. Psychiatric Present- Depression. Not Present- Anxiety, Bipolar, Change in Sleep Pattern, Fearful and Frequent  crying. Endocrine Not Present- Cold Intolerance,  Excessive Hunger, Hair Changes, Heat Intolerance, Hot flashes and New Diabetes. Hematology Not Present- Blood Thinners, Easy Bruising, Excessive bleeding, Gland problems, HIV and Persistent Infections. All other systems negative  Vitals Sabino Gasser; 01/06/2018 8:59 AM) 01/06/2018 8:58 AM Weight: 278 lb Height: 66in Body Surface Area: 2.3 m Body Mass Index: 44.87 kg/m  Temp.: 98.18F(Oral)  Pulse: 103 (Regular)  BP: 124/82 (Sitting, Left Arm, Standard)       Physical Exam Randall Hiss M. Heena Woodbury MD; 01/06/2018 11:04 AM) General Mental Status-Alert. General Appearance-Consistent with stated age. Hydration-Well hydrated. Voice-Normal. Note: severe obesity   Integumentary Note: On the left proximal lateral hip. There is a well-circumscribed soft tissue mass. Slightly mobile. No overlying skin changes. It is oval in shape. Approximately 14 cm x 10 cm. Soft. Nontender. Left lower extremity neurovascularly intact   Head and Neck Head-normocephalic, atraumatic with no lesions or palpable masses. Trachea-midline. Thyroid Gland Characteristics - normal size and consistency.  Eye Eyeball - Bilateral-Extraocular movements intact. Sclera/Conjunctiva - Bilateral-No scleral icterus.  ENMT Ears -Note: normal ext ears.  Mouth and Throat -Note: normal lips.   Chest and Lung Exam Chest and lung exam reveals -quiet, even and easy respiratory effort with no use of accessory muscles and on auscultation, normal breath sounds, no adventitious sounds and normal vocal resonance. Inspection Chest Wall - Normal. Back - normal.  Breast - Did not examine.  Cardiovascular Cardiovascular examination reveals -normal heart sounds, regular rate and rhythm with no murmurs and normal pedal pulses bilaterally.  Abdomen Inspection Inspection of the abdomen reveals - No Hernias. Skin - Scar - no surgical  scars. Palpation/Percussion Palpation and Percussion of the abdomen reveal - Soft, Non Tender, No Rebound tenderness, No Rigidity (guarding) and No hepatosplenomegaly. Auscultation Auscultation of the abdomen reveals - Bowel sounds normal.  Peripheral Vascular Upper Extremity Palpation - Pulses bilaterally normal.  Neurologic Neurologic evaluation reveals -alert and oriented x 3 with no impairment of recent or remote memory. Mental Status-Normal.  Neuropsychiatric The patient's mood and affect are described as -normal. Judgment and Insight-insight is appropriate concerning matters relevant to self.  Musculoskeletal Normal Exam - Left-Upper Extremity Strength Normal and Lower Extremity Strength Normal. Normal Exam - Right-Upper Extremity Strength Normal and Lower Extremity Strength Normal.  Lymphatic Head & Neck  General Head & Neck Lymphatics: Bilateral - Description - Normal. Axillary -Note: no palpable LAD.  Femoral & Inguinal -Note: no palpable LAD.     Assessment & Plan Randall Hiss M. 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 I don't think it is an epidermoid inclusion cyst. We discussed that these are generally benign lesions and that the possibility of cancer would be exceedingly unusual. We discussed observation versus surgical excision. We discussed the etiology and management of lipomas. The patient was given educational material. We discussed that the majority of lipomas are benign although on a rare occasion it can be malignant.  We discussed observation versus surgical excision. We discussed the risks and benefits of surgery including but not limited to bleeding, infection, injury to surrounding structures, scarring, cosmetic concerns, possible temporary drain placement, blood clot formation, anesthesia issues, possible recurrence, and the typical  postoperative course.  The patient has elected to proceed with surgical excision however she would like to wait till October. I think that is reasonable. Current Plans You are being scheduled for surgery- Our schedulers will call you.  You should hear from our  office's scheduling department within 5 working days about the location, date, and time of surgery. We try to make accommodations for patient's preferences in scheduling surgery, but sometimes the OR schedule or the surgeon's schedule prevents [place] from making those accommodations.  If you have not heard from our office [phone]) in 5 working days, call the office and ask for your surgeon's nurse.  If you have other questions about your diagnosis, plan, or surgery, call the office and ask for your surgeon's nurse.  Leighton Ruff. Redmond Pulling, MD, FACS General, Bariatric, & Minimally Invasive Surgery [facility], [place]

## 2018-02-25 NOTE — Pre-Procedure Instructions (Signed)
[name] [name]  02/25/2018      [place], [place] - [address] [place] [address] Phone: [phone] Fax: [phone]  [id number] - [place], [place] - [address] [place] [address] Phone: [phone] Fax: [phone]    Your procedure is scheduled on September 12  Report to [facility] at [place].M.  Call this number if you have problems the morning of surgery:  [phone]   Remember:  Do not eat after midnight.   You may drink clear liquids until 0430 am .  Clear liquids allowed are:  Water, Juice (non-citric and without pulp), Carbonated beverages, Black Coffee only and Gatorade    Take these medicines the morning of surgery with A SIP OF WATER  citalopram (CELEXA) doxycycline (VIBRA-TABS)  7 days prior to surgery STOP taking any Aspirin(unless otherwise instructed by your surgeon), Aleve, Naproxen, Ibuprofen, Motrin, Advil, Goody's, BC's, all herbal medications, fish oil, and all vitamins     Do not wear jewelry, make-up or nail polish.  Do not wear lotions, powders, or perfumes, or deodorant.  Do not shave 48 hours prior to surgery.    Do not bring valuables to the hospital.  [facility] is not responsible for any belongings or valuables.  Contacts, dentures or bridgework may not be worn into surgery.  Leave your suitcase in the car.  After surgery it may be brought to your room.  For patients admitted to the hospital, discharge time will be determined by your treatment team.  Patients discharged the day of surgery will not be allowed to drive home.    Special instructions:   [facility]- Preparing For Surgery  Before surgery, you can play an important role. Because skin is not sterile, your skin needs to be as free of germs as possible. You can reduce the number of germs on your skin by washing with CHG (chlorahexidine  gluconate) Soap before surgery.  CHG is an antiseptic cleaner which kills germs and bonds with the skin to continue killing germs even after washing.    Oral Hygiene is also important to reduce your risk of infection.  Remember - BRUSH YOUR TEETH THE MORNING OF SURGERY WITH YOUR REGULAR TOOTHPASTE  Please do not use if you have an allergy to CHG or antibacterial soaps. If your skin becomes reddened/irritated stop using the CHG.  Do not shave (including legs and underarms) for at least 48 hours prior to first CHG shower. It is OK to shave your face.  Please follow these instructions carefully.   1. Shower the NIGHT BEFORE SURGERY and the MORNING OF SURGERY with CHG.   2. If you chose to wash your hair, wash your hair first as usual with your normal shampoo.  3. After you shampoo, rinse your hair and body thoroughly to remove the shampoo.  4. Use CHG as you would any other liquid soap. You can apply CHG directly to the skin and wash gently with a scrungie or a clean washcloth.   5. Apply the CHG Soap to your body ONLY FROM THE NECK DOWN.  Do not use on open wounds or open sores. Avoid contact with your eyes, ears, mouth and genitals (private parts). Wash Face and genitals (private parts)  with your normal soap.  6. Wash thoroughly, paying special attention to the area where your surgery will be performed.  7. Thoroughly rinse your body with warm water from the neck down.  8. DO NOT shower/wash with your normal soap after using and rinsing off the CHG Soap.  9. Pat yourself dry with a CLEAN TOWEL.  10. Wear CLEAN PAJAMAS to bed the night before surgery, wear comfortable clothes the morning of surgery  11. Place CLEAN SHEETS on your bed the night of your first shower and DO NOT SLEEP WITH PETS.    Day of Surgery:  Do not apply any deodorants/lotions.  Please wear clean clothes to the hospital/surgery center.   Remember to brush your teeth WITH YOUR REGULAR TOOTHPASTE.    Please  read over the following fact sheets that you were given.

## 2018-02-26 ENCOUNTER — Inpatient Hospital Stay (HOSPITAL_COMMUNITY)
Admission: RE | Admit: 2018-02-26 | Discharge: 2018-02-26 | Disposition: A | Discharge status: Home / Self Care | Payer: 59 | Source: Ambulatory Visit

## 2018-04-12 ENCOUNTER — Other Ambulatory Visit: Payer: Self-pay

## 2018-04-12 DIAGNOSIS — D485 Neoplasm of uncertain behavior of skin: Secondary | ICD-10-CM | POA: Diagnosis not present

## 2018-04-12 DIAGNOSIS — D2371 Other benign neoplasm of skin of right lower limb, including hip: Secondary | ICD-10-CM | POA: Diagnosis not present

## 2018-04-12 DIAGNOSIS — L709 Acne, unspecified: Secondary | ICD-10-CM | POA: Diagnosis not present

## 2018-04-12 DIAGNOSIS — D229 Melanocytic nevi, unspecified: Secondary | ICD-10-CM | POA: Diagnosis not present

## 2018-04-15 NOTE — Pre-Procedure Instructions (Signed)
[name] [name]  04/15/2018      [place], [place] [address] [employer] [place] [address] Phone: [phone] Fax: [phone]  [id number] - [place], [place] - [address] [place] [address] Phone: [phone] Fax: [phone]    Your procedure is scheduled on April 23, 2018.  Report to [facility] Admitting at 530 AM.  Call this number if you have problems the morning of surgery:  [phone]   Remember:  Do not eat or drink after midnight.  You may drink clear liquids until 430 AM.  Clear liquids allowed are:                    Water, Juice (non-citric and without pulp), Clear Tea, Black Coffee only and Gatorade    Take these medicines the morning of surgery with A SIP OF WATER  Citalopram (celexa)  7 days prior to surgery STOP taking any Aspirin (unless otherwise instructed by your surgeon), Aleve, Naproxen, Ibuprofen, Motrin, Advil, Goody's, BC's, all herbal medications, fish oil, and all vitamins   Do not wear jewelry, make-up or nail polish.  Do not wear lotions, powders, or perfumes, or deodorant.  Do not shave 48 hours prior to surgery.    Do not bring valuables to the hospital.  [facility] is not responsible for any belongings or valuables.  Contacts, dentures or bridgework may not be worn into surgery.  Leave your suitcase in the car.  After surgery it may be brought to your room.  For patients admitted to the hospital, discharge time will be determined by your treatment team.  Patients discharged the day of surgery will not be allowed to drive home.    [place]- Preparing For Surgery  Before surgery, you can play an important role. Because skin is not sterile, your skin needs to be as free of germs as possible. You can reduce the number of germs on your skin by washing with CHG (chlorahexidine gluconate) Soap before surgery.  CHG is  an antiseptic cleaner which kills germs and bonds with the skin to continue killing germs even after washing.    Oral Hygiene is also important to reduce your risk of infection.  Remember - BRUSH YOUR TEETH THE MORNING OF SURGERY WITH YOUR REGULAR TOOTHPASTE  Please do not use if you have an allergy to CHG or antibacterial soaps. If your skin becomes reddened/irritated stop using the CHG.  Do not shave (including legs and underarms) for at least 48 hours prior to first CHG shower. It is OK to shave your face.  Please follow these instructions carefully.   1. Shower the NIGHT BEFORE SURGERY and the MORNING OF SURGERY with CHG.   2. If you chose to wash your hair, wash your hair first as usual with your normal shampoo.  3. After you shampoo, rinse your hair and body thoroughly to remove the shampoo.  4. Use CHG as you would any other liquid soap. You can apply CHG directly to the skin and wash gently with a scrungie or a clean washcloth.   5. Apply the CHG Soap to your body ONLY FROM THE NECK DOWN.  Do not use on open wounds or open sores. Avoid contact with your eyes, ears, mouth and genitals (private parts). Wash Face and genitals (private parts)  with your normal soap.  6. Wash thoroughly, paying  special attention to the area where your surgery will be performed.  7. Thoroughly rinse your body with warm water from the neck down.  8. DO NOT shower/wash with your normal soap after using and rinsing off the CHG Soap.  9. Pat yourself dry with a CLEAN TOWEL.  10. Wear CLEAN PAJAMAS to bed the night before surgery, wear comfortable clothes the morning of surgery  11. Place CLEAN SHEETS on your bed the night of your first shower and DO NOT SLEEP WITH PETS.  Day of Surgery:  Do not apply any deodorants/lotions.  Please wear clean clothes to the hospital/surgery center.   Remember to brush your teeth WITH YOUR REGULAR TOOTHPASTE.  Please read over the following fact sheets that you were  given. Pain Booklet, Coughing and Deep Breathing and Surgical Site Infection Prevention

## 2018-04-16 ENCOUNTER — Encounter (HOSPITAL_COMMUNITY)
Admission: RE | Admit: 2018-04-16 | Discharge: 2018-04-16 | Disposition: A | Discharge status: Home / Self Care | Payer: 59 | Source: Ambulatory Visit | Attending: General Surgery | Admitting: General Surgery

## 2018-04-16 ENCOUNTER — Encounter (HOSPITAL_COMMUNITY): Payer: Self-pay

## 2018-04-16 ENCOUNTER — Other Ambulatory Visit: Payer: Self-pay

## 2018-04-16 DIAGNOSIS — Z01818 Encounter for other preprocedural examination: Secondary | ICD-10-CM | POA: Insufficient documentation

## 2018-04-16 DIAGNOSIS — I10 Essential (primary) hypertension: Secondary | ICD-10-CM | POA: Insufficient documentation

## 2018-04-16 LAB — CBC
HCT: 37.5 % (ref 36.0–46.0)
Hemoglobin: 11.6 g/dL — ABNORMAL LOW (ref 12.0–15.0)
MCH: 27.7 pg (ref 26.0–34.0)
MCHC: 30.9 g/dL (ref 30.0–36.0)
MCV: 89.5 fL (ref 80.0–100.0)
PLATELETS: 409 10*3/uL — AB (ref 150–400)
RBC: 4.19 MIL/uL (ref 3.87–5.11)
RDW: 13.7 % (ref 11.5–15.5)
WBC: 10.3 10*3/uL (ref 4.0–10.5)
nRBC: 0 % (ref 0.0–0.2)

## 2018-04-16 LAB — BASIC METABOLIC PANEL
Anion gap: 9 (ref 5–15)
BUN: 12 mg/dL (ref 6–20)
CALCIUM: 8.9 mg/dL (ref 8.9–10.3)
CO2: 22 mmol/L (ref 22–32)
CREATININE: 0.57 mg/dL (ref 0.44–1.00)
Chloride: 104 mmol/L (ref 98–111)
GLUCOSE: 86 mg/dL (ref 70–99)
Potassium: 4.8 mmol/L (ref 3.5–5.1)
Sodium: 135 mmol/L (ref 135–145)

## 2018-04-16 LAB — SURGICAL PCR SCREEN
MRSA, PCR: NEGATIVE
STAPHYLOCOCCUS AUREUS: NEGATIVE

## 2018-04-16 NOTE — Progress Notes (Signed)
PCP - Dr. Bebe Shaggy Cardiologist - patient denies  Chest x-ray - n/a EKG - 04/16/2018 Stress Test - patient denies ECHO - patient unsure Cardiac Cath - patient denies  Sleep Study - patient denies  Anesthesia review: n/a  Patient denies shortness of breath, fever, cough and chest pain at PAT appointment   Patient verbalized understanding of instructions that were given to them at the PAT appointment. Patient was also instructed that they will need to review over the PAT instructions again at home before surgery.

## 2018-04-22 MED ORDER — VANCOMYCIN HCL 10 G IV SOLR
1500.0000 mg | INTRAVENOUS | Status: DC
  Filled 2018-04-22: qty 1500

## 2018-04-22 NOTE — H&P (Signed)
Judd Gaudier Documented: 01/06/2018 8:56 AM Location: [facility] Patient #: [id number] DOB: 21-May-1976 Married / Language: Cleophus Molt / Race: White Female   History of Present Illness Randall Hiss M. Tandre Conly MD; 01/06/2018 11:05 AM) The patient is a 42 year old female who presents with a soft tissue mass. She is referred by Dr Karlton Lemon for left hip soft tissue mass. She states that she noticed it about 6 weeks ago. It causes no pain or discomfort. She denies any trauma to the area. She denies any skin changes. She denies any weight change. She denies any numbness or tingling in her left leg or weakness. She denies any other soft tissue lumps or bumps. 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 She denies any lymphadenopathy. Because she has had a prior parotid tumor she is always a little bit concerned when a new soft tissue mass shows up. She denies any fevers or chills. 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 The referring physician did a bedside ultrasound showing a homogeneous mass without calcification, necrotic features or vascularity.   Problem List/Past Medical Randall Hiss M. 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 Caffeine use  Carbonated beverages, Coffee, Tea. No drug use  Tobacco use  Former smoker.  Family History Sabino Gasser; 01/06/2018 8:57 AM) Breast Cancer  Mother. Diabetes Mellitus  Mother. Hypertension  Mother. Migraine Headache  Mother. Ovarian Cancer   Mother.  Pregnancy / Birth History Sabino Gasser; 01/06/2018 8:57 AM) Age at menarche  29 years. Gravida  2 Length (months) of breastfeeding  7-12 Maternal age  50-25 Para  2 Regular periods   Other Problems Randall Hiss M. Redmond Pulling, MD; 01/06/2018 11:06 AM) Back Pain  Bladder Problems  Cancer  Depression  High blood pressure     Review of Systems Randall Hiss M. Willet Schleifer MD; 01/06/2018 11:02 AM) General Not Present- Appetite Loss, Chills, Fatigue, Fever, Night Sweats, Weight Gain and Weight Loss. Skin Not Present- Change in Wart/Mole, Dryness, Hives, Jaundice, New Lesions, Non-Healing Wounds, Rash and Ulcer. HEENT Present- Seasonal Allergies and Wears glasses/contact lenses. Not Present- Earache, Hearing Loss, Hoarseness, Nose Bleed, Oral Ulcers, Ringing in the Ears, Sinus Pain, Sore Throat, Visual Disturbances and Yellow Eyes. Respiratory Not Present- Bloody sputum, Chronic Cough, Difficulty Breathing, Snoring and Wheezing. Breast Not Present- Breast Mass, Breast Pain, Nipple Discharge and Skin Changes. Cardiovascular Not Present- Chest Pain, Difficulty Breathing Lying Down, Leg Cramps, Palpitations, Rapid Heart Rate, Shortness of Breath and Swelling of Extremities. Gastrointestinal Not Present- Abdominal Pain, Bloating, Bloody Stool, Change in Bowel Habits, Chronic diarrhea, Constipation, Difficulty Swallowing, Excessive gas, Gets full quickly at meals, Hemorrhoids, Indigestion, Nausea, Rectal Pain and Vomiting. Female Genitourinary Present- Frequency. Not Present- Nocturia, Painful Urination, Pelvic Pain and Urgency. Musculoskeletal Present- Back Pain. Not Present- Joint Pain, Joint Stiffness, Muscle Pain, Muscle Weakness and Swelling of Extremities. Neurological Not Present- Decreased Memory, Fainting, Headaches, Numbness, Seizures, Tingling, Tremor, Trouble walking and Weakness. Psychiatric Present- Depression. Not Present- Anxiety, Bipolar, Change in Sleep Pattern, Fearful and Frequent  crying. Endocrine Not Present- Cold Intolerance,  Excessive Hunger, Hair Changes, Heat Intolerance, Hot flashes and New Diabetes. Hematology Not Present- Blood Thinners, Easy Bruising, Excessive bleeding, Gland problems, HIV and Persistent Infections. All other systems negative  Vitals Sabino Gasser; 01/06/2018 8:59 AM) 01/06/2018 8:58 AM Weight: 278 lb Height: 66in Body Surface Area: 2.3 m Body Mass Index: 44.87 kg/m  Temp.: 98.72F(Oral)  Pulse: 103 (Regular)  BP: 124/82 (Sitting, Left Arm, Standard)       Physical Exam Randall Hiss M. Layan Zalenski MD; 01/06/2018 11:04 AM) General Mental Status-Alert. General Appearance-Consistent with stated age. Hydration-Well hydrated. Voice-Normal. Note: severe obesity   Integumentary Note: On the left proximal lateral hip. There is a well-circumscribed soft tissue mass. Slightly mobile. No overlying skin changes. It is oval in shape. Approximately 14 cm x 10 cm. Soft. Nontender. Left lower extremity neurovascularly intact   Head and Neck Head-normocephalic, atraumatic with no lesions or palpable masses. Trachea-midline. Thyroid Gland Characteristics - normal size and consistency.  Eye Eyeball - Bilateral-Extraocular movements intact. Sclera/Conjunctiva - Bilateral-No scleral icterus.  ENMT Ears -Note: normal ext ears.  Mouth and Throat -Note: normal lips.   Chest and Lung Exam Chest and lung exam reveals -quiet, even and easy respiratory effort with no use of accessory muscles and on auscultation, normal breath sounds, no adventitious sounds and normal vocal resonance. Inspection Chest Wall - Normal. Back - normal.  Breast - Did not examine.  Cardiovascular Cardiovascular examination reveals -normal heart sounds, regular rate and rhythm with no murmurs and normal pedal pulses bilaterally.  Abdomen Inspection Inspection of the abdomen reveals - No Hernias. Skin - Scar - no surgical  scars. Palpation/Percussion Palpation and Percussion of the abdomen reveal - Soft, Non Tender, No Rebound tenderness, No Rigidity (guarding) and No hepatosplenomegaly. Auscultation Auscultation of the abdomen reveals - Bowel sounds normal.  Peripheral Vascular Upper Extremity Palpation - Pulses bilaterally normal.  Neurologic Neurologic evaluation reveals -alert and oriented x 3 with no impairment of recent or remote memory. Mental Status-Normal.  Neuropsychiatric The patient's mood and affect are described as -normal. Judgment and Insight-insight is appropriate concerning matters relevant to self.  Musculoskeletal Normal Exam - Left-Upper Extremity Strength Normal and Lower Extremity Strength Normal. Normal Exam - Right-Upper Extremity Strength Normal and Lower Extremity Strength Normal.  Lymphatic Head & Neck  General Head & Neck Lymphatics: Bilateral - Description - Normal. Axillary -Note: no palpable LAD.  Femoral & Inguinal -Note: no palpable LAD.     Assessment & Plan Randall Hiss M. 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 I don't think it is an epidermoid inclusion cyst. We discussed that these are generally benign lesions and that the possibility of cancer would be exceedingly unusual. We discussed observation versus surgical excision. We discussed the etiology and management of lipomas. The patient was given educational material. We discussed that the majority of lipomas are benign although on a rare occasion it can be malignant.  We discussed observation versus surgical excision. We discussed the risks and benefits of surgery including but not limited to bleeding, infection, injury to surrounding structures, scarring, cosmetic concerns, possible temporary drain placement, blood clot formation, anesthesia issues, possible recurrence, and the typical  postoperative course.  The patient has elected to proceed with surgical excision however she would like to wait till October. I think that is reasonable. Current Plans You are being scheduled for surgery- Our schedulers will call you.  You should hear from our  office's scheduling department within 5 working days about the location, date, and time of surgery. We try to make accommodations for patient's preferences in scheduling surgery, but sometimes the OR schedule or the surgeon's schedule prevents [place] from making those accommodations.  If you have not heard from our office [phone]) in 5 working days, call the office and ask for your surgeon's nurse.  If you have other questions about your diagnosis, plan, or surgery, call the office and ask for your surgeon's nurse.  Leighton Ruff. Redmond Pulling, MD, FACS General, Bariatric, & Minimally Invasive Surgery [facility], [place]

## 2018-04-23 ENCOUNTER — Ambulatory Visit (HOSPITAL_COMMUNITY): Admission: RE | Admit: 2018-04-23 | Payer: 59 | Source: Ambulatory Visit | Admitting: General Surgery

## 2018-04-23 ENCOUNTER — Encounter (HOSPITAL_COMMUNITY): Admission: RE | Payer: Self-pay | Source: Ambulatory Visit

## 2018-04-23 SURGERY — EXCISION LIPOMA
Anesthesia: Monitor Anesthesia Care | Laterality: Left

## 2018-04-26 DIAGNOSIS — L709 Acne, unspecified: Secondary | ICD-10-CM | POA: Diagnosis not present

## 2018-04-26 DIAGNOSIS — L678 Other hair color and hair shaft abnormalities: Secondary | ICD-10-CM | POA: Diagnosis not present

## 2018-04-26 DIAGNOSIS — D72829 Elevated white blood cell count, unspecified: Secondary | ICD-10-CM | POA: Diagnosis not present

## 2018-04-27 ENCOUNTER — Other Ambulatory Visit (HOSPITAL_BASED_OUTPATIENT_CLINIC_OR_DEPARTMENT_OTHER): Payer: Self-pay | Admitting: Obstetrics and Gynecology

## 2018-04-27 DIAGNOSIS — Z1239 Encounter for other screening for malignant neoplasm of breast: Secondary | ICD-10-CM

## 2018-04-30 ENCOUNTER — Other Ambulatory Visit: Payer: Self-pay | Admitting: General Surgery

## 2018-04-30 DIAGNOSIS — D1724 Benign lipomatous neoplasm of skin and subcutaneous tissue of left leg: Secondary | ICD-10-CM

## 2018-05-01 ENCOUNTER — Ambulatory Visit (HOSPITAL_BASED_OUTPATIENT_CLINIC_OR_DEPARTMENT_OTHER)
Admission: RE | Admit: 2018-05-01 | Discharge: 2018-05-01 | Disposition: A | Discharge status: Home / Self Care | Payer: 59 | Source: Ambulatory Visit | Attending: General Surgery | Admitting: General Surgery

## 2018-05-01 DIAGNOSIS — D1724 Benign lipomatous neoplasm of skin and subcutaneous tissue of left leg: Secondary | ICD-10-CM | POA: Diagnosis not present

## 2018-05-03 ENCOUNTER — Other Ambulatory Visit: Payer: 59

## 2018-05-20 DIAGNOSIS — D1724 Benign lipomatous neoplasm of skin and subcutaneous tissue of left leg: Secondary | ICD-10-CM | POA: Diagnosis not present

## 2018-05-25 ENCOUNTER — Ambulatory Visit (HOSPITAL_BASED_OUTPATIENT_CLINIC_OR_DEPARTMENT_OTHER)
Admission: RE | Admit: 2018-05-25 | Discharge: 2018-05-25 | Disposition: A | Discharge status: Home / Self Care | Payer: 59 | Source: Ambulatory Visit | Attending: Obstetrics and Gynecology | Admitting: Obstetrics and Gynecology

## 2018-05-25 ENCOUNTER — Other Ambulatory Visit: Payer: Self-pay

## 2018-05-25 ENCOUNTER — Encounter (HOSPITAL_BASED_OUTPATIENT_CLINIC_OR_DEPARTMENT_OTHER): Payer: Self-pay

## 2018-05-25 DIAGNOSIS — Z1231 Encounter for screening mammogram for malignant neoplasm of breast: Secondary | ICD-10-CM | POA: Diagnosis not present

## 2018-05-25 DIAGNOSIS — Z1239 Encounter for other screening for malignant neoplasm of breast: Secondary | ICD-10-CM | POA: Diagnosis not present

## 2018-05-25 MED FILL — LISINOPRIL 20 MG TABLET: 20 | 90 days supply | Qty: 90 | Fill #0

## 2018-05-25 MED FILL — CITALOPRAM HBR 20 MG TABLET: 20 | 90 days supply | Qty: 90 | Fill #0

## 2018-05-31 ENCOUNTER — Encounter (HOSPITAL_BASED_OUTPATIENT_CLINIC_OR_DEPARTMENT_OTHER)
Admission: RE | Disposition: A | Discharge status: Home / Self Care | Payer: Self-pay | Source: Ambulatory Visit | Attending: General Surgery

## 2018-05-31 ENCOUNTER — Encounter (HOSPITAL_BASED_OUTPATIENT_CLINIC_OR_DEPARTMENT_OTHER): Payer: Self-pay

## 2018-05-31 ENCOUNTER — Other Ambulatory Visit: Payer: Self-pay

## 2018-05-31 ENCOUNTER — Ambulatory Visit (HOSPITAL_BASED_OUTPATIENT_CLINIC_OR_DEPARTMENT_OTHER): Payer: 59 | Admitting: Anesthesiology

## 2018-05-31 ENCOUNTER — Ambulatory Visit (HOSPITAL_BASED_OUTPATIENT_CLINIC_OR_DEPARTMENT_OTHER)
Admission: RE | Admit: 2018-05-31 | Discharge: 2018-05-31 | Disposition: A | Discharge status: Home / Self Care | Payer: 59 | Source: Ambulatory Visit | Attending: General Surgery | Admitting: General Surgery

## 2018-05-31 DIAGNOSIS — D171 Benign lipomatous neoplasm of skin and subcutaneous tissue of trunk: Secondary | ICD-10-CM | POA: Insufficient documentation

## 2018-05-31 DIAGNOSIS — I10 Essential (primary) hypertension: Secondary | ICD-10-CM | POA: Insufficient documentation

## 2018-05-31 DIAGNOSIS — Z882 Allergy status to sulfonamides status: Secondary | ICD-10-CM | POA: Diagnosis not present

## 2018-05-31 DIAGNOSIS — Z6841 Body Mass Index (BMI) 40.0 and over, adult: Secondary | ICD-10-CM | POA: Diagnosis not present

## 2018-05-31 DIAGNOSIS — D1724 Benign lipomatous neoplasm of skin and subcutaneous tissue of left leg: Secondary | ICD-10-CM | POA: Diagnosis not present

## 2018-05-31 DIAGNOSIS — D1739 Benign lipomatous neoplasm of skin and subcutaneous tissue of other sites: Secondary | ICD-10-CM | POA: Diagnosis not present

## 2018-05-31 DIAGNOSIS — F419 Anxiety disorder, unspecified: Secondary | ICD-10-CM | POA: Insufficient documentation

## 2018-05-31 DIAGNOSIS — F329 Major depressive disorder, single episode, unspecified: Secondary | ICD-10-CM | POA: Diagnosis not present

## 2018-05-31 DIAGNOSIS — Z87891 Personal history of nicotine dependence: Secondary | ICD-10-CM | POA: Diagnosis not present

## 2018-05-31 HISTORY — DX: Adverse effect of unspecified anesthetic, initial encounter: T41.45XA

## 2018-05-31 HISTORY — DX: Other specified postprocedural states: R11.2

## 2018-05-31 HISTORY — DX: Other complications of anesthesia, initial encounter: T88.59XA

## 2018-05-31 HISTORY — PX: LIPOMA EXCISION: SHX5283

## 2018-05-31 HISTORY — DX: Other specified postprocedural states: Z98.890

## 2018-05-31 SURGERY — EXCISION LIPOMA
Anesthesia: Monitor Anesthesia Care | Site: Hip | Laterality: Left

## 2018-05-31 MED ORDER — KETOROLAC TROMETHAMINE 30 MG/ML IJ SOLN
INTRAMUSCULAR | Status: DC | PRN
  Administered 2018-05-31: 30 mg via INTRAVENOUS

## 2018-05-31 MED ORDER — SCOPOLAMINE 1 MG/3DAYS TD PT72: 1.0000 | MEDICATED_PATCH | Freq: Once | TRANSDERMAL | Status: DC | PRN

## 2018-05-31 MED ORDER — OXYCODONE HCL 5 MG/5ML PO SOLN: 5.0000 mg | Freq: Once | ORAL | Status: DC | PRN

## 2018-05-31 MED ORDER — PHENYLEPHRINE 40 MCG/ML (10ML) SYRINGE FOR IV PUSH (FOR BLOOD PRESSURE SUPPORT)
PREFILLED_SYRINGE | INTRAVENOUS | Status: DC | PRN
  Administered 2018-05-31 (×2): 80 ug via INTRAVENOUS
  Administered 2018-05-31: 40 ug via INTRAVENOUS
  Administered 2018-05-31 (×3): 80 ug via INTRAVENOUS
  Administered 2018-05-31: 40 ug via INTRAVENOUS
  Administered 2018-05-31: 80 ug via INTRAVENOUS

## 2018-05-31 MED ORDER — FENTANYL CITRATE (PF) 100 MCG/2ML IJ SOLN
INTRAMUSCULAR | Status: AC
  Filled 2018-05-31: qty 2

## 2018-05-31 MED ORDER — ONDANSETRON HCL 4 MG/2ML IJ SOLN
INTRAMUSCULAR | Status: AC
  Filled 2018-05-31: qty 2

## 2018-05-31 MED ORDER — PROMETHAZINE HCL 25 MG/ML IJ SOLN: 6.2500 mg | INTRAMUSCULAR | Status: DC | PRN

## 2018-05-31 MED ORDER — ACETAMINOPHEN 500 MG PO TABS
ORAL_TABLET | ORAL | Status: AC
  Filled 2018-05-31: qty 2

## 2018-05-31 MED ORDER — VANCOMYCIN HCL 10 G IV SOLR: 1500.0000 mg | INTRAVENOUS | Status: DC

## 2018-05-31 MED ORDER — PHENYLEPHRINE 40 MCG/ML (10ML) SYRINGE FOR IV PUSH (FOR BLOOD PRESSURE SUPPORT)
PREFILLED_SYRINGE | INTRAVENOUS | Status: AC
  Filled 2018-05-31: qty 20

## 2018-05-31 MED ORDER — GABAPENTIN 300 MG PO CAPS: 300.0000 mg | ORAL_CAPSULE | ORAL | Status: DC

## 2018-05-31 MED ORDER — VANCOMYCIN HCL IN DEXTROSE 500-5 MG/100ML-% IV SOLN
INTRAVENOUS | Status: AC
  Filled 2018-05-31: qty 100

## 2018-05-31 MED ORDER — MIDAZOLAM HCL 2 MG/2ML IJ SOLN: 1.0000 mg | INTRAMUSCULAR | Status: DC | PRN

## 2018-05-31 MED ORDER — MIDAZOLAM HCL 5 MG/5ML IJ SOLN
INTRAMUSCULAR | Status: DC | PRN
  Administered 2018-05-31: 2 mg via INTRAVENOUS

## 2018-05-31 MED ORDER — ONDANSETRON HCL 4 MG/2ML IJ SOLN
INTRAMUSCULAR | Status: DC | PRN
  Administered 2018-05-31: 4 mg via INTRAVENOUS

## 2018-05-31 MED ORDER — EPHEDRINE SULFATE-NACL 50-0.9 MG/10ML-% IV SOSY
PREFILLED_SYRINGE | INTRAVENOUS | Status: DC | PRN
  Administered 2018-05-31 (×2): 10 mg via INTRAVENOUS

## 2018-05-31 MED ORDER — OXYCODONE HCL 5 MG PO TABS: 5.0000 mg | ORAL_TABLET | Freq: Four times a day (QID) | ORAL | 0 refills | Status: DC | PRN

## 2018-05-31 MED ORDER — DEXAMETHASONE SODIUM PHOSPHATE 10 MG/ML IJ SOLN
INTRAMUSCULAR | Status: AC
  Filled 2018-05-31: qty 1

## 2018-05-31 MED ORDER — PROPOFOL 10 MG/ML IV BOLUS
INTRAVENOUS | Status: AC
  Filled 2018-05-31: qty 20

## 2018-05-31 MED ORDER — LIDOCAINE 2% (20 MG/ML) 5 ML SYRINGE
INTRAMUSCULAR | Status: DC | PRN
  Administered 2018-05-31: 100 mg via INTRAVENOUS

## 2018-05-31 MED ORDER — GABAPENTIN 300 MG PO CAPS
ORAL_CAPSULE | ORAL | Status: AC
  Filled 2018-05-31: qty 1

## 2018-05-31 MED ORDER — KETOROLAC TROMETHAMINE 30 MG/ML IJ SOLN
INTRAMUSCULAR | Status: AC
  Filled 2018-05-31: qty 1

## 2018-05-31 MED ORDER — FENTANYL CITRATE (PF) 100 MCG/2ML IJ SOLN: 50.0000 ug | INTRAMUSCULAR | Status: DC | PRN

## 2018-05-31 MED ORDER — FENTANYL CITRATE (PF) 100 MCG/2ML IJ SOLN
INTRAMUSCULAR | Status: DC | PRN
  Administered 2018-05-31: 25 ug via INTRAVENOUS
  Administered 2018-05-31: 50 ug via INTRAVENOUS
  Administered 2018-05-31 (×3): 25 ug via INTRAVENOUS

## 2018-05-31 MED ORDER — HYDROMORPHONE HCL 1 MG/ML IJ SOLN: 0.2500 mg | INTRAMUSCULAR | Status: DC | PRN

## 2018-05-31 MED ORDER — MIDAZOLAM HCL 2 MG/2ML IJ SOLN
INTRAMUSCULAR | Status: AC
  Filled 2018-05-31: qty 2

## 2018-05-31 MED ORDER — LACTATED RINGERS IV SOLN
INTRAVENOUS | Status: DC
  Administered 2018-05-31 (×2): via INTRAVENOUS

## 2018-05-31 MED ORDER — DIPHENHYDRAMINE HCL 50 MG/ML IJ SOLN
6.2500 mg | Freq: Once | INTRAMUSCULAR | Status: AC
  Administered 2018-05-31: 6.25 mg via INTRAVENOUS

## 2018-05-31 MED ORDER — DEXAMETHASONE SODIUM PHOSPHATE 10 MG/ML IJ SOLN
INTRAMUSCULAR | Status: DC | PRN
  Administered 2018-05-31: 10 mg via INTRAVENOUS

## 2018-05-31 MED ORDER — VANCOMYCIN HCL IN DEXTROSE 1-5 GM/200ML-% IV SOLN
1000.0000 mg | Freq: Once | INTRAVENOUS | Status: AC
  Administered 2018-05-31 (×2): 1000 mg via INTRAVENOUS

## 2018-05-31 MED ORDER — VANCOMYCIN HCL 10 G IV SOLR: 1500.0000 mg | Freq: Once | INTRAVENOUS | Status: DC

## 2018-05-31 MED ORDER — CHLORHEXIDINE GLUCONATE CLOTH 2 % EX PADS: 6.0000 | MEDICATED_PAD | Freq: Once | CUTANEOUS | Status: DC

## 2018-05-31 MED ORDER — GABAPENTIN 300 MG PO CAPS
300.0000 mg | ORAL_CAPSULE | ORAL | Status: AC
  Administered 2018-05-31: 300 mg via ORAL

## 2018-05-31 MED ORDER — ACETAMINOPHEN 500 MG PO TABS
1000.0000 mg | ORAL_TABLET | ORAL | Status: AC
  Administered 2018-05-31: 1000 mg via ORAL

## 2018-05-31 MED ORDER — ACETAMINOPHEN 500 MG PO TABS: 1000.0000 mg | ORAL_TABLET | ORAL | Status: DC

## 2018-05-31 MED ORDER — DIPHENHYDRAMINE HCL 50 MG/ML IJ SOLN
INTRAMUSCULAR | Status: AC
  Filled 2018-05-31: qty 1

## 2018-05-31 MED ORDER — LIDOCAINE 2% (20 MG/ML) 5 ML SYRINGE
INTRAMUSCULAR | Status: AC
  Filled 2018-05-31: qty 5

## 2018-05-31 MED ORDER — OXYCODONE HCL 5 MG PO TABS: 5.0000 mg | ORAL_TABLET | Freq: Once | ORAL | Status: DC | PRN

## 2018-05-31 MED ORDER — PROPOFOL 10 MG/ML IV BOLUS
INTRAVENOUS | Status: DC | PRN
  Administered 2018-05-31: 50 mg via INTRAVENOUS
  Administered 2018-05-31: 200 mg via INTRAVENOUS
  Administered 2018-05-31: 50 mg via INTRAVENOUS

## 2018-05-31 MED ORDER — EPHEDRINE 5 MG/ML INJ
INTRAVENOUS | Status: AC
  Filled 2018-05-31: qty 10

## 2018-05-31 MED ORDER — BUPIVACAINE-EPINEPHRINE 0.25% -1:200000 IJ SOLN
INTRAMUSCULAR | Status: DC | PRN
  Administered 2018-05-31: 30 mL

## 2018-05-31 MED ORDER — VANCOMYCIN HCL IN DEXTROSE 1-5 GM/200ML-% IV SOLN
INTRAVENOUS | Status: AC
  Filled 2018-05-31: qty 200

## 2018-05-31 MED FILL — oxyCODONE HCL 5 MG TABS: 5 | 3 days supply | Qty: 15 | Fill #0

## 2018-05-31 SURGICAL SUPPLY — 53 items
BENZOIN TINCTURE PRP APPL 2/3 (GAUZE/BANDAGES/DRESSINGS) IMPLANT
BLADE CLIPPER SURG (BLADE) IMPLANT
BLADE HEX COATED 2.75 (ELECTRODE) ×3 IMPLANT
BLADE SURG 15 STRL LF DISP TIS (BLADE) ×1 IMPLANT
BLADE SURG 15 STRL SS (BLADE) ×2
CANISTER SUCT 1200ML W/VALVE (MISCELLANEOUS) IMPLANT
CHLORAPREP W/TINT 26ML (MISCELLANEOUS) ×3 IMPLANT
CLOSURE WOUND 1/2 X4 (GAUZE/BANDAGES/DRESSINGS)
COVER BACK TABLE 60X90IN (DRAPES) ×3 IMPLANT
COVER MAYO STAND STRL (DRAPES) ×3 IMPLANT
COVER WAND RF STERILE (DRAPES) IMPLANT
DECANTER SPIKE VIAL GLASS SM (MISCELLANEOUS) IMPLANT
DERMABOND ADVANCED (GAUZE/BANDAGES/DRESSINGS) ×2
DERMABOND ADVANCED .7 DNX12 (GAUZE/BANDAGES/DRESSINGS) ×1 IMPLANT
DRAPE LAPAROTOMY 100X72 PEDS (DRAPES) ×3 IMPLANT
DRAPE UTILITY XL STRL (DRAPES) ×3 IMPLANT
DRSG TEGADERM 4X4.75 (GAUZE/BANDAGES/DRESSINGS) ×3 IMPLANT
ELECT COATED BLADE 2.86 ST (ELECTRODE) IMPLANT
ELECT REM PT RETURN 9FT ADLT (ELECTROSURGICAL) ×3
ELECTRODE REM PT RTRN 9FT ADLT (ELECTROSURGICAL) ×1 IMPLANT
GAUZE PACKING IODOFORM 1/4X15 (GAUZE/BANDAGES/DRESSINGS) IMPLANT
GAUZE SPONGE 4X4 12PLY STRL (GAUZE/BANDAGES/DRESSINGS) IMPLANT
GAUZE SPONGE 4X4 12PLY STRL LF (GAUZE/BANDAGES/DRESSINGS) IMPLANT
GLOVE BIO SURGEON STRL SZ7.5 (GLOVE) ×3 IMPLANT
GLOVE BIOGEL PI IND STRL 8 (GLOVE) ×2 IMPLANT
GLOVE BIOGEL PI INDICATOR 8 (GLOVE) ×4
GOWN STRL REUS W/ TWL LRG LVL3 (GOWN DISPOSABLE) ×1 IMPLANT
GOWN STRL REUS W/ TWL XL LVL3 (GOWN DISPOSABLE) ×1 IMPLANT
GOWN STRL REUS W/TWL LRG LVL3 (GOWN DISPOSABLE) ×2
GOWN STRL REUS W/TWL XL LVL3 (GOWN DISPOSABLE) ×2
NEEDLE HYPO 25X1 1.5 SAFETY (NEEDLE) ×3 IMPLANT
NS IRRIG 1000ML POUR BTL (IV SOLUTION) IMPLANT
PACK BASIN DAY SURGERY FS (CUSTOM PROCEDURE TRAY) ×3 IMPLANT
PENCIL BUTTON HOLSTER BLD 10FT (ELECTRODE) ×3 IMPLANT
STRIP CLOSURE SKIN 1/2X4 (GAUZE/BANDAGES/DRESSINGS) IMPLANT
SUT ETHILON 2 0 FS 18 (SUTURE) IMPLANT
SUT ETHILON 4 0 PS 2 18 (SUTURE) IMPLANT
SUT MNCRL AB 4-0 PS2 18 (SUTURE) ×3 IMPLANT
SUT SILK 2 0 SH (SUTURE) IMPLANT
SUT VIC AB 2-0 SH 27 (SUTURE)
SUT VIC AB 2-0 SH 27XBRD (SUTURE) IMPLANT
SUT VIC AB 4-0 SH 18 (SUTURE) IMPLANT
SUT VICRYL 3-0 CR8 SH (SUTURE) IMPLANT
SUT VICRYL 4-0 PS2 18IN ABS (SUTURE) IMPLANT
SWAB COLLECTION DEVICE MRSA (MISCELLANEOUS) IMPLANT
SWAB CULTURE ESWAB REG 1ML (MISCELLANEOUS) IMPLANT
SYR CONTROL 10ML LL (SYRINGE) ×3 IMPLANT
TOWEL GREEN STERILE FF (TOWEL DISPOSABLE) ×3 IMPLANT
TOWEL OR NON WOVEN STRL DISP B (DISPOSABLE) ×3 IMPLANT
TUBE CONNECTING 20'X1/4 (TUBING) ×1
TUBE CONNECTING 20X1/4 (TUBING) ×2 IMPLANT
UNDERPAD 30X30 (UNDERPADS AND DIAPERS) IMPLANT
YANKAUER SUCT BULB TIP NO VENT (SUCTIONS) IMPLANT

## 2018-05-31 NOTE — Anesthesia Procedure Notes (Signed)
Procedure Name: LMA Insertion Date/Time: 05/31/2018 2:01 PM Performed by: Genelle Bal, CRNA Pre-anesthesia Checklist: Patient identified, Emergency Drugs available, Suction available and Patient being monitored Patient Re-evaluated:Patient Re-evaluated prior to induction Oxygen Delivery Method: Circle system utilized Preoxygenation: Pre-oxygenation with 100% oxygen Induction Type: IV induction Ventilation: Mask ventilation without difficulty LMA: LMA inserted LMA Size: 5.0 Number of attempts: 1 Airway Equipment and Method: Bite block Placement Confirmation: positive ETCO2 Tube secured with: Tape Dental Injury: Teeth and Oropharynx as per pre-operative assessment

## 2018-05-31 NOTE — Op Note (Signed)
05/31/2018  2:49 PM  PATIENT:  [name] [name]  42 y.o. female  PRE-OPERATIVE DIAGNOSIS:  Left hip lipoma  POST-OPERATIVE DIAGNOSIS:  Left hip lipoma  PROCEDURE:  Procedure(s): EXCISION OF LEFT HIP LIPOMA (14 x 10cm)  SURGEON:  Surgeon(s): Greer Pickerel, MD  ASSISTANTS: none   ANESTHESIA:   general  EBL: minimal   DRAINS: none   LOCAL MEDICATIONS USED:  MARCAINE     SPECIMEN:  Source of Specimen:  left hip lipoma  DISPOSITION OF SPECIMEN:  PATHOLOGY  COUNTS:  YES  INDICATION FOR PROCEDURE: 42 year old female presented for elective removal of a left hip soft tissue mass.  It was enlarging in size and causing discomfort.  On physical exam and imaging it was consistent with lipoma.  She desires surgical excision.  We discussed risk and benefits of the procedure please see outside records for additional information.  PROCEDURE: Enhanced recovery protocol was used.  Patient received oral Tylenol and gabapentin prior to surgery.  After confirming the site with the patient in the holding area and marking the area with my initials she was taken to operating room on at the outpatient surgery center.  General LMA anesthesia was established.  Sequential compression devices were placed.  She was placed in the right lateral position with the appropriate padding.  Her left hip was prepped and draped in the usual standard surgical fashion.  She received IV antibiotic prior to skin incision.  A surgical timeout was performed.  The lesion measured approximately 14 cm x 10 cm in an oblique fashion on her left lateral hip.  It was outlined with a marking pen.  I then drew an incision directly over it with a marking pen.  Local was infiltrated.  Then a 12 cm incision was made with a 15 blade.  Deep dermis was divided with electrocautery along with subcutaneous fat.  The lipoma was encapsulated the capsule was opened and I was able to develop a plane around the mass.  Cautery was used to free it from  surrounding soft tissue.  It was quite bulky.  I was able to free it from the area in its entirety.  No remaining lipoma material was left behind.  The cavity was inspected.  Additional local was infiltrated in a regional fashion.  Hemostasis was achieved.  I reapproximated the deep dermis with inverted interrupted 3-0 Vicryl sutures and closed the skin with a running 4-0 Monocryl in a subcuticular fashion.  We then applied benzoin, Steri-Strips, gauze and sterile bandage.  The patient was extubated and taken to the recovery room in stable condition.  All needle, instrument, and sponge counts were correct x2.  There were no immediate complications.  PLAN OF CARE: Discharge to home after PACU  PATIENT DISPOSITION:  PACU - hemodynamically stable.   Delay start of Pharmacological VTE agent (>24hrs) due to surgical blood loss or risk of bleeding:  not applicable  Leighton Ruff. Redmond Pulling, MD, FACS General, Bariatric, & Minimally Invasive Surgery [facility], [place]

## 2018-05-31 NOTE — Transfer of Care (Signed)
Immediate Anesthesia Transfer of Care Note  Patient: [name] [name]  Procedure(s) Performed: EXCISION OF LEFT HIP LIPOMA (Left Hip)  Patient Location: PACU  Anesthesia Type:General  Level of Consciousness: awake, alert  and oriented  Airway & Oxygen Therapy: Patient Spontanous Breathing and Patient connected to face mask oxygen  Post-op Assessment: Report given to RN and Post -op Vital signs reviewed and stable  Post vital signs: Reviewed and stable  Last Vitals:  Vitals Value Taken Time  BP 130/64 05/31/2018  2:54 PM  Temp    Pulse 121 05/31/2018  2:56 PM  Resp 21 05/31/2018  2:56 PM  SpO2 95 % 05/31/2018  2:56 PM  Vitals shown include unvalidated device data.  Last Pain:  Vitals:   05/31/18 1144  TempSrc: Oral  PainSc: 0-No pain         Complications: No apparent anesthesia complications

## 2018-05-31 NOTE — Discharge Instructions (Signed)
[facility] Office Phone Number [phone]   POST OP INSTRUCTIONS  Always review your discharge instruction sheet given to you by the facility where your surgery was performed.  IF YOU HAVE DISABILITY OR FAMILY LEAVE FORMS, YOU MUST BRING THEM TO THE OFFICE FOR PROCESSING.  DO NOT GIVE THEM TO YOUR DOCTOR.  1. See pain control handout below 2. Take your usually prescribed medications unless otherwise directed 3. If you need a refill on your pain medication, please contact your pharmacy.  They will contact our office to request authorization.  Prescriptions will not be filled after 5pm or on week-ends. 4. You should eat very light the first 24 hours after surgery, such as soup, crackers, pudding, etc.  Resume your normal diet the day after surgery. 5. Most patients will experience some swelling and bruising in the hip.  Ice packs and compression garments will help.  Swelling and bruising can take several days to resolve.  6. It is common to experience some constipation if taking pain medication after surgery.  Increasing fluid intake and taking a stool softener will usually help or prevent this problem from occurring.  A mild laxative (Milk of Magnesia or Miralax) should be taken according to package directions if there are no bowel movements after 48 hours. 7. Unless discharge instructions indicate otherwise, you may remove your bandages 48 hours after surgery, and you may shower at that time.  You may have steri-strips (small skin tapes) in place directly over the incision.  These strips should be left on the skin for 7-10 days.  If your surgeon used skin glue on the incision, you may shower in 24 hours.  The glue will flake off over the next 2-3 weeks.  Any sutures or staples will be removed at the office during your follow-up visit. 8. ACTIVITIES:  You may resume regular daily activities (gradually increasing) beginning the next day.  Wearing a good support bra or sports bra  minimizes pain and swelling.  You may have sexual intercourse when it is comfortable. a. You may drive when you no longer are taking prescription pain medication, you can comfortably wear a seatbelt, and you can safely maneuver your car and apply brakes. b. RETURN TO WORK:   9. You should see your doctor in the office for a follow-up appointment approximately two weeks after your surgery.  Your doctors nurse will typically make your follow-up appointment when she calls you with your pathology report.  Expect your pathology report 2-3 business days after your surgery.  You may call to check if you do not hear from [place] after three days. OTHER INSTRUCTIONS: NO HEAVY LIFTING/PUSHING/PULLING WITH LEFT LEG WHEN TO CALL YOUR DOCTOR: 1. Fever over 101.0 2. Nausea and/or vomiting. 3. Extreme swelling or bruising. 4. Continued bleeding from incision. 5. Increased pain, redness, or drainage from the incision.  The clinic staff is available to answer your questions during regular business hours.  Please dont hesitate to call and ask to speak to one of the nurses for clinical concerns.  If you have a medical emergency, go to the nearest emergency room or call 911.  A surgeon from [facility] is always on call at the hospital.  For further questions, please visit [email or web address]      Managing Your Pain After Surgery Without Opioids    Thank you for participating in our program to help patients manage their pain after surgery without opioids. This is part of our effort to provide you with  the best care possible, without exposing you or your family to the risk that opioids pose.  What pain can I expect after surgery? You can expect to have some pain after surgery. This is normal. The pain is typically worse the day after surgery, and quickly begins to get better. Many studies have found that many patients are able to manage their pain after surgery with Over-the-Counter  (OTC) medications such as Tylenol and Motrin. If you have a condition that does not allow you to take Tylenol or Motrin, notify your surgical team.  How will I manage my pain? The best strategy for controlling your pain after surgery is around the clock pain control with Tylenol (acetaminophen) and Motrin (ibuprofen or Advil). Alternating these medications with each other allows you to maximize your pain control. In addition to Tylenol and Motrin, you can use heating pads or ice packs on your incisions to help reduce your pain.  How will I alternate your regular strength over-the-counter pain medication? You will take a dose of pain medication every three hours. ; Start by taking 650 mg of Tylenol (2 pills of 325 mg) ; 3 hours later take 600 mg of Motrin (3 pills of 200 mg) ; 3 hours after taking the Motrin take 650 mg of Tylenol ; 3 hours after that take 600 mg of Motrin.   - 1 -  See example - if your first dose of Tylenol is at 12:00 PM   12:00 PM Tylenol 650 mg (2 pills of 325 mg)  3:00 PM Motrin 600 mg (3 pills of 200 mg)  6:00 PM Tylenol 650 mg (2 pills of 325 mg)  9:00 PM Motrin 600 mg (3 pills of 200 mg)  Continue alternating every 3 hours   We recommend that you follow this schedule around-the-clock for at least 3 days after surgery, or until you feel that it is no longer needed. Use the table on the last page of this handout to keep track of the medications you are taking. Important: Do not take more than 3000mg  of Tylenol or 3200mg  of Motrin in a 24-hour period. Do not take ibuprofen/Motrin if you have a history of bleeding stomach ulcers, severe kidney disease, &/or actively taking a blood thinner  What if I still have pain? If you have pain that is not controlled with the over-the-counter pain medications (Tylenol and Motrin or Advil) you might have what we call breakthrough pain. You will receive a prescription for a small amount of an opioid pain medication such as  Oxycodone, Tramadol, or Tylenol with Codeine. Use these opioid pills in the first 24 hours after surgery if you have breakthrough pain. Do not take more than 1 pill every 4-6 hours.  If you still have uncontrolled pain after using all opioid pills, don't hesitate to call our staff using the number provided. We will help make sure you are managing your pain in the best way possible, and if necessary, we can provide a prescription for additional pain medication.   Day 1    Time  Name of Medication Number of pills taken  Amount of Acetaminophen  Pain Level   Comments  AM PM       AM PM       AM PM       AM PM       AM PM       AM PM       AM PM  AM PM       Total Daily amount of Acetaminophen Do not take more than  3,000 mg per day      Day 2    Time  Name of Medication Number of pills taken  Amount of Acetaminophen  Pain Level   Comments  AM PM       AM PM       AM PM       AM PM       AM PM       AM PM       AM PM       AM PM       Total Daily amount of Acetaminophen Do not take more than  3,000 mg per day      Day 3    Time  Name of Medication Number of pills taken  Amount of Acetaminophen  Pain Level   Comments  AM PM       AM PM       AM PM       AM PM          AM PM       AM PM       AM PM       AM PM       Total Daily amount of Acetaminophen Do not take more than  3,000 mg per day      Day 4    Time  Name of Medication Number of pills taken  Amount of Acetaminophen  Pain Level   Comments  AM PM       AM PM       AM PM       AM PM       AM PM       AM PM       AM PM       AM PM       Total Daily amount of Acetaminophen Do not take more than  3,000 mg per day      Day 5    Time  Name of Medication Number of pills taken  Amount of Acetaminophen  Pain Level   Comments  AM PM       AM PM       AM PM       AM PM       AM PM       AM PM       AM PM       AM PM       Total Daily amount of Acetaminophen Do not  take more than  3,000 mg per day       Day 6    Time  Name of Medication Number of pills taken  Amount of Acetaminophen  Pain Level  Comments  AM PM       AM PM       AM PM       AM PM       AM PM       AM PM       AM PM       AM PM       Total Daily amount of Acetaminophen Do not take more than  3,000 mg per day      Day 7    Time  Name of Medication Number of pills taken  Amount of Acetaminophen  Pain Level  Comments  AM PM       AM PM       AM PM       AM PM       AM PM       AM PM       AM PM       AM PM       Total Daily amount of Acetaminophen Do not take more than  3,000 mg per day        For additional information about how and where to safely dispose of unused opioid medications - [employer]  Disclaimer: This document contains information and/or instructional materials adapted from [facility] for the typical patient with your condition. It does not replace medical advice from your health care provider because your experience may differ from that of the typical patient. Talk to your health care provider if you have any questions about this document, your condition or your treatment plan. Adapted from [place] Instructions  Activity: Get plenty of rest for the remainder of the day. A responsible individual must stay with you for 24 hours following the procedure.  For the next 24 hours, DO NOT: -Drive a car -Paediatric nurse -Drink alcoholic beverages -Take any medication unless instructed by your physician -Make any legal decisions or sign important papers.  Meals: Start with liquid foods such as gelatin or soup. Progress to regular foods as tolerated. Avoid greasy, spicy, heavy foods. If nausea and/or vomiting occur, drink only clear liquids until the nausea and/or vomiting subsides. Call your physician if vomiting continues.  Special Instructions/Symptoms: Your throat  may feel dry or sore from the anesthesia or the breathing tube placed in your throat during surgery. If this causes discomfort, gargle with warm salt water. The discomfort should disappear within 24 hours.  If you had a scopolamine patch placed behind your ear for the management of post- operative nausea and/or vomiting:  1. The medication in the patch is effective for 72 hours, after which it should be removed.  Wrap patch in a tissue and discard in the trash. Wash hands thoroughly with soap and water. 2. You may remove the patch earlier than 72 hours if you experience unpleasant side effects which may include dry mouth, dizziness or visual disturbances. 3. Avoid touching the patch. Wash your hands with soap and water after contact with the patch.

## 2018-05-31 NOTE — H&P (Signed)
Judd Gaudier Documented: 05/20/2018 11:24 AM Location: [facility] Patient #: [id number] DOB: March 20, 1976 Married / Language: Cleophus Molt / Race: White Female   History of Present Illness Randall Hiss M. Lucine Bilski MD; 05/20/2018 11:44 AM) The patient is a 42 year old female who presents with a soft tissue mass. She comes back in for additional follow-up regarding her left hip soft tissue mass. She states that it now is starting to bother her. It is interfering with daily activities especially at the end of the work shift. She denies any fevers or chills. She denies any lymphadenopathy. She denies any swelling of her lower leg. But she is now starting to have discomfort in that area especially after being on her feet. She had an ultrasound of the area which revealed a soft tissue mass measuring 7.5 x 3.7 x 12.1 cm isoechoic to adjacent subcutaneous fat. It corresponds to be a lipoma however when compared to a prior PET/CT from 4 years ago the area has increased in size.  01/06/18 She is referred by Dr Karlton Lemon for left hip soft tissue mass. She states that she noticed it about 6 weeks ago. It causes no pain or discomfort. She denies any trauma to the area. She denies any skin changes. She denies any weight change. She denies any numbness or tingling in her left leg or weakness. She denies any other soft tissue lumps or bumps. 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 She denies any lymphadenopathy. Because she has had a prior parotid tumor she is always a little bit concerned when a new soft tissue mass shows up. She denies any fevers or chills. 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 The referring physician did a bedside ultrasound showing a homogeneous mass without calcification, necrotic features or vascularity.   Problem List/Past Medical Randall Hiss M. 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 Redmond Pulling, MD;  05/20/2018 11:46 AM) Cesarean Section - 1  Spinal Surgery - Lower Back   Diagnostic Studies History Randall Hiss M. Redmond Pulling, MD; 05/20/2018 11:46 AM) Colonoscopy  never Mammogram  within last year Pap Smear  1-5 years ago  Allergies (Tanisha A. [facility] Shark, [place]; 05/20/2018 11:25 AM) Sulfa Drugs  Keflex *CEPHALOSPORINS*  Allergies Reconciled   Medication History (Tanisha A. Owens Shark, RMA; 05/20/2018 11:25 AM) Lisinopril (20MG  Tablet, Oral) Active. Medications Reconciled  Social History Randall Hiss M. Redmond Pulling, MD; 05/20/2018 11:46 AM) Alcohol use  Occasional alcohol use. Caffeine use  Carbonated beverages, Coffee, Tea. No drug use  Tobacco use  Former smoker.  Family History Randall Hiss M. Redmond Pulling, MD; 05/20/2018 11:46 AM) Breast Cancer  Mother. Diabetes Mellitus  Mother. Hypertension  Mother. Migraine Headache  Mother. Ovarian Cancer  Mother.  Pregnancy / Birth History Randall Hiss M. Redmond Pulling, MD; 05/20/2018 11:46 AM) Age at menarche  5 years. Gravida  2 Length (months) of breastfeeding  7-12 Maternal age  67-25 Para  2 Regular periods   Other Problems Randall Hiss M. Redmond Pulling, MD; 05/20/2018 11:46 AM) Back Pain  Bladder Problems  Cancer  Depression  High blood pressure   Vitals (Tanisha A. Brown RMA; 05/20/2018 11:25 AM) 05/20/2018 11:24 AM Weight: 275.6 lb Height: 66in Body Surface Area: 2.29 m Body Mass Index: 44.48 kg/m  Temp.: 98.58F  Pulse: 102 (Regular)  BP: 126/88 (Sitting, Left Arm, Standard)       Physical Exam Randall Hiss M. Leigh Blas MD; 05/20/2018 11:43 AM) General Mental Status-Alert. General Appearance-Consistent with stated age. Hydration-Well hydrated. Voice-Normal.  Note: severe obesity   Integumentary Note: On the left proximal lateral hip. There is a well-circumscribed soft tissue mass. Slightly mobile. No overlying skin changes. It is oval in shape. Approximately 14 cm x 10 cm. Soft. Nontender. Left lower extremity  neurovascularly intact   Head and Neck Head-normocephalic, atraumatic with no lesions or palpable masses. Trachea-midline. Thyroid Gland Characteristics - normal size and consistency.  Eye Eyeball - Bilateral-Extraocular movements intact. Sclera/Conjunctiva - Bilateral-No scleral icterus.  ENMT Ears -Note: normal ext ears.  Mouth and Throat -Note: normal lips.   Chest and Lung Exam Chest and lung exam reveals -quiet, even and easy respiratory effort with no use of accessory muscles and on auscultation, normal breath sounds, no adventitious sounds and normal vocal resonance. Inspection Chest Wall - Normal. Back - normal.  Breast - Did not examine.  Cardiovascular Cardiovascular examination reveals -normal heart sounds, regular rate and rhythm with no murmurs and normal pedal pulses bilaterally.  Abdomen Inspection Inspection of the abdomen reveals - No Hernias. Skin - Scar - no surgical scars. Palpation/Percussion Palpation and Percussion of the abdomen reveal - Soft, Non Tender, No Rebound tenderness, No Rigidity (guarding) and No hepatosplenomegaly. Auscultation Auscultation of the abdomen reveals - Bowel sounds normal.  Peripheral Vascular Upper Extremity Palpation - Pulses bilaterally normal.  Neurologic Neurologic evaluation reveals -alert and oriented x 3 with no impairment of recent or remote memory. Mental Status-Normal.  Neuropsychiatric The patient's mood and affect are described as -normal. Judgment and Insight-insight is appropriate concerning matters relevant to self.  Musculoskeletal Normal Exam - Left-Upper Extremity Strength Normal and Lower Extremity Strength Normal. Normal Exam - Right-Upper Extremity Strength Normal and Lower Extremity Strength Normal.  Lymphatic Head & Neck  General Head & Neck Lymphatics: Bilateral - Description - Normal. Axillary -Note: no palpable LAD.  Femoral & Inguinal -Note: no  palpable LAD.     Assessment & Plan Randall Hiss M. 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 I don't think it is an epidermoid inclusion cyst. We discussed that these are generally benign lesions and that the possibility of cancer would be exceedingly unusual. We discussed observation versus surgical excision. We discussed the etiology and management of lipomas. We discussed that the majority of lipomas are benign although on a rare occasion it can be malignant.  We discussed observation versus surgical excision. We discussed the risks and benefits of surgery including but not limited to bleeding, infection, injury to surrounding structures, scarring, cosmetic concerns, seroma, hematoma, possible temporary drain placement, blood clot formation, anesthesia issues, possible recurrence, and the typical postoperative course.  With her now having discomfort in the area from the mass as well as an increase in size compared to a few years ago I recommended surgical excision. The patient is interested in proceeding with surgery. Current Plans You are being scheduled for surgery- Our schedulers will call you.  You should hear from our office's scheduling department within 5 working days about the location, date, and time of surgery. We try to make accommodations for patient's preferences in scheduling surgery, but sometimes the OR schedule or the surgeon's schedule prevents [place] from making those accommodations.  If you have not heard from our office [phone]) in 5 working days, call the office and ask for your surgeon's nurse.  If you have other questions about your diagnosis, plan, or surgery, call the office  and ask for your surgeon's nurse.  Leighton Ruff. Redmond Pulling, MD, FACS General, Bariatric, & Minimally Invasive Surgery [facility], [place]

## 2018-05-31 NOTE — Anesthesia Preprocedure Evaluation (Addendum)
Anesthesia Evaluation    History of Anesthesia Complications (+) PONV  Airway Mallampati: I  TM Distance: >3 FB Neck ROM: Full    Dental no notable dental hx.    Pulmonary former smoker,    Pulmonary exam normal breath sounds clear to auscultation       Cardiovascular hypertension, Pt. on medications Normal cardiovascular exam Rhythm:Regular Rate:Normal     Neuro/Psych Anxiety Depression    GI/Hepatic   Endo/Other  Morbid obesity  Renal/GU      Musculoskeletal   Abdominal (+) + obese,   Peds  Hematology   Anesthesia Other Findings   Reproductive/Obstetrics                             Anesthesia Physical  Anesthesia Plan  ASA: III  Anesthesia Plan: General   Post-op Pain Management:    Induction: Intravenous  PONV Risk Score and Plan: 3 and Ondansetron, Dexamethasone and Midazolam  Airway Management Planned: LMA  Additional Equipment:   Intra-op Plan:   Post-operative Plan: Extubation in OR  Informed Consent: I have reviewed the patients History and Physical, chart, labs and discussed the procedure including the risks, benefits and alternatives for the proposed anesthesia with the patient or authorized representative who has indicated his/her understanding and acceptance.   Dental advisory given  Plan Discussed with: CRNA and Surgeon  Anesthesia Plan Comments:        Anesthesia Quick Evaluation

## 2018-06-01 ENCOUNTER — Encounter (HOSPITAL_BASED_OUTPATIENT_CLINIC_OR_DEPARTMENT_OTHER): Payer: Self-pay | Admitting: General Surgery

## 2018-06-01 NOTE — Anesthesia Postprocedure Evaluation (Signed)
Anesthesia Post Note  Patient: [name] [name]  Procedure(s) Performed: EXCISION OF LEFT HIP LIPOMA (Left Hip)     Patient location during evaluation: PACU Anesthesia Type: General Level of consciousness: awake and alert Pain management: pain level controlled Vital Signs Assessment: post-procedure vital signs reviewed and stable Respiratory status: spontaneous breathing, nonlabored ventilation and respiratory function stable Cardiovascular status: blood pressure returned to baseline and stable Postop Assessment: no apparent nausea or vomiting Anesthetic complications: no    Last Vitals:  Vitals:   05/31/18 1530 05/31/18 1554  BP: 107/64 115/64  Pulse: 100 (!) 104  Resp: 12 20  Temp:  37 C  SpO2: 96% 97%    Last Pain:  Vitals:   05/31/18 1554  TempSrc:   PainSc: 0-No pain                 Lynda Rainwater

## 2018-07-07 DIAGNOSIS — I10 Essential (primary) hypertension: Secondary | ICD-10-CM | POA: Diagnosis not present

## 2018-07-07 DIAGNOSIS — F4321 Adjustment disorder with depressed mood: Secondary | ICD-10-CM | POA: Diagnosis not present

## 2018-07-07 DIAGNOSIS — F419 Anxiety disorder, unspecified: Secondary | ICD-10-CM | POA: Diagnosis not present

## 2018-08-11 DIAGNOSIS — F419 Anxiety disorder, unspecified: Secondary | ICD-10-CM | POA: Diagnosis not present

## 2018-08-11 DIAGNOSIS — I10 Essential (primary) hypertension: Secondary | ICD-10-CM | POA: Diagnosis not present

## 2018-08-11 DIAGNOSIS — Z8049 Family history of malignant neoplasm of other genital organs: Secondary | ICD-10-CM | POA: Diagnosis not present

## 2018-08-11 DIAGNOSIS — J019 Acute sinusitis, unspecified: Secondary | ICD-10-CM | POA: Diagnosis not present

## 2018-08-11 DIAGNOSIS — Z803 Family history of malignant neoplasm of breast: Secondary | ICD-10-CM | POA: Diagnosis not present

## 2018-08-11 DIAGNOSIS — Z01411 Encounter for gynecological examination (general) (routine) with abnormal findings: Secondary | ICD-10-CM | POA: Diagnosis not present

## 2018-08-11 MED FILL — AZITHROMYCIN 250 MG TABLET: 250 | 5 days supply | Qty: 6 | Fill #0

## 2018-08-11 MED FILL — FLUCONAZOLE 200 MG TAB: 200 | 6 days supply | Qty: 3 | Fill #0

## 2018-08-26 MED FILL — CITALOPRAM HBR 20 MG TABLET: 20 | 90 days supply | Qty: 90 | Fill #0

## 2018-08-26 MED FILL — LISINOPRIL 20 MG TABLET: 20 | 90 days supply | Qty: 90 | Fill #0

## 2018-09-07 DIAGNOSIS — H5213 Myopia, bilateral: Secondary | ICD-10-CM | POA: Diagnosis not present

## 2018-09-16 MED FILL — ONDANSETRON ODT 4 MG TABLET: 4 | 7 days supply | Qty: 20 | Fill #0

## 2018-09-16 MED FILL — TIVICAY 50 MG TABLET: 50 | 5 days supply | Qty: 5 | Fill #0

## 2018-09-16 MED FILL — DESCOVY 200-25 MG TABS: 200-25 | 5 days supply | Qty: 5 | Fill #0

## 2018-12-01 MED FILL — CITALOPRAM HBR 20 MG TABLET: 20 | 90 days supply | Qty: 90 | Fill #1

## 2018-12-01 MED FILL — ONDANSETRON ODT 4 MG TABLET: 4 | 7 days supply | Qty: 20 | Fill #1

## 2018-12-01 MED FILL — LISINOPRIL 20 MG TABLET: 20 | 90 days supply | Qty: 90 | Fill #1

## 2018-12-16 MED FILL — valACYclovir HCL 1 GM TABS: 1 | 7 days supply | Qty: 21 | Fill #0

## 2019-02-04 DIAGNOSIS — Z85818 Personal history of malignant neoplasm of other sites of lip, oral cavity, and pharynx: Secondary | ICD-10-CM | POA: Diagnosis not present

## 2019-02-04 DIAGNOSIS — C07 Malignant neoplasm of parotid gland: Secondary | ICD-10-CM | POA: Diagnosis not present

## 2019-03-15 MED FILL — LISINOPRIL 20 MG TABLET: 20 | 90 days supply | Qty: 90 | Fill #2

## 2019-03-17 DIAGNOSIS — B379 Candidiasis, unspecified: Secondary | ICD-10-CM | POA: Diagnosis not present

## 2019-03-17 DIAGNOSIS — L02411 Cutaneous abscess of right axilla: Secondary | ICD-10-CM | POA: Diagnosis not present

## 2019-03-17 DIAGNOSIS — T3695XA Adverse effect of unspecified systemic antibiotic, initial encounter: Secondary | ICD-10-CM | POA: Diagnosis not present

## 2019-03-17 MED FILL — FLUCONAZOLE 150 MG TABS: 150 | 10 days supply | Qty: 5 | Fill #0

## 2019-03-17 MED FILL — CLINDAMYCIN HCL 300 MG CAPS: 300 | 10 days supply | Qty: 30 | Fill #0

## 2019-05-17 ENCOUNTER — Other Ambulatory Visit (HOSPITAL_BASED_OUTPATIENT_CLINIC_OR_DEPARTMENT_OTHER): Payer: Self-pay | Admitting: Obstetrics and Gynecology

## 2019-05-17 DIAGNOSIS — Z1239 Encounter for other screening for malignant neoplasm of breast: Secondary | ICD-10-CM

## 2019-05-31 ENCOUNTER — Ambulatory Visit (HOSPITAL_BASED_OUTPATIENT_CLINIC_OR_DEPARTMENT_OTHER)
Admission: RE | Admit: 2019-05-31 | Discharge: 2019-05-31 | Disposition: A | Discharge status: Home / Self Care | Payer: 59 | Source: Ambulatory Visit | Attending: Obstetrics and Gynecology | Admitting: Obstetrics and Gynecology

## 2019-05-31 ENCOUNTER — Other Ambulatory Visit: Payer: Self-pay

## 2019-05-31 DIAGNOSIS — Z1231 Encounter for screening mammogram for malignant neoplasm of breast: Secondary | ICD-10-CM | POA: Insufficient documentation

## 2019-05-31 DIAGNOSIS — Z1239 Encounter for other screening for malignant neoplasm of breast: Secondary | ICD-10-CM

## 2019-06-03 MED FILL — LISINOPRIL 20 MG TABLET: 20 | 90 days supply | Qty: 90 | Fill #3

## 2019-08-08 DIAGNOSIS — M545 Low back pain: Secondary | ICD-10-CM | POA: Diagnosis not present

## 2019-08-23 DIAGNOSIS — M47816 Spondylosis without myelopathy or radiculopathy, lumbar region: Secondary | ICD-10-CM | POA: Diagnosis not present

## 2019-08-23 DIAGNOSIS — M545 Low back pain: Secondary | ICD-10-CM | POA: Diagnosis not present

## 2019-08-23 DIAGNOSIS — M7918 Myalgia, other site: Secondary | ICD-10-CM | POA: Diagnosis not present

## 2019-08-23 DIAGNOSIS — I10 Essential (primary) hypertension: Secondary | ICD-10-CM | POA: Diagnosis not present

## 2019-08-23 DIAGNOSIS — M961 Postlaminectomy syndrome, not elsewhere classified: Secondary | ICD-10-CM | POA: Diagnosis not present

## 2019-08-26 ENCOUNTER — Other Ambulatory Visit (HOSPITAL_BASED_OUTPATIENT_CLINIC_OR_DEPARTMENT_OTHER): Payer: Self-pay | Admitting: Family Medicine

## 2019-08-26 DIAGNOSIS — I10 Essential (primary) hypertension: Secondary | ICD-10-CM | POA: Diagnosis not present

## 2019-08-26 MED FILL — LISINOPRIL 20 MG TABS: 20 | 90 days supply | Qty: 90 | Fill #0

## 2019-08-29 MED FILL — ONDANSETRON ODT 4 MG TABLET: 4 | 7 days supply | Qty: 20 | Fill #2

## 2019-09-26 DIAGNOSIS — H5213 Myopia, bilateral: Secondary | ICD-10-CM | POA: Diagnosis not present

## 2019-10-25 DIAGNOSIS — Z85818 Personal history of malignant neoplasm of other sites of lip, oral cavity, and pharynx: Secondary | ICD-10-CM | POA: Diagnosis not present

## 2019-10-25 DIAGNOSIS — Z1151 Encounter for screening for human papillomavirus (HPV): Secondary | ICD-10-CM | POA: Diagnosis not present

## 2019-10-25 DIAGNOSIS — Z01419 Encounter for gynecological examination (general) (routine) without abnormal findings: Secondary | ICD-10-CM | POA: Diagnosis not present

## 2019-10-25 MED FILL — PROGESTERONE MICRONIZED 200: 200 | 10 days supply | Qty: 20 | Fill #0

## 2019-12-28 MED FILL — LISINOPRIL 20 MG TABS: 20 | 90 days supply | Qty: 90 | Fill #1

## 2020-02-09 MED FILL — CITALOPRAM HBR 20 MG TABLET: 20 | 90 days supply | Qty: 90 | Fill #0

## 2020-03-27 DIAGNOSIS — Z20828 Contact with and (suspected) exposure to other viral communicable diseases: Secondary | ICD-10-CM | POA: Diagnosis not present

## 2020-03-29 MED FILL — FLUARIX QUADRIVALENT 0.5 ML: 0.5 | 1 days supply | Qty: 1 | Fill #0

## 2020-04-10 ENCOUNTER — Other Ambulatory Visit (HOSPITAL_BASED_OUTPATIENT_CLINIC_OR_DEPARTMENT_OTHER): Payer: Self-pay | Admitting: Obstetrics and Gynecology

## 2020-04-10 DIAGNOSIS — Z1231 Encounter for screening mammogram for malignant neoplasm of breast: Secondary | ICD-10-CM

## 2020-04-10 MED FILL — LISINOPRIL 20 MG TABS: 20 | 90 days supply | Qty: 90 | Fill #2

## 2020-05-09 DIAGNOSIS — Z111 Encounter for screening for respiratory tuberculosis: Secondary | ICD-10-CM | POA: Diagnosis not present

## 2020-05-09 DIAGNOSIS — Z789 Other specified health status: Secondary | ICD-10-CM | POA: Diagnosis not present

## 2020-05-09 DIAGNOSIS — Z1159 Encounter for screening for other viral diseases: Secondary | ICD-10-CM | POA: Diagnosis not present

## 2020-05-15 MED FILL — CITALOPRAM HBR 20 MG TABLET: 20 | 90 days supply | Qty: 90 | Fill #1

## 2020-05-18 DIAGNOSIS — H40053 Ocular hypertension, bilateral: Secondary | ICD-10-CM | POA: Diagnosis not present

## 2020-06-04 ENCOUNTER — Ambulatory Visit (HOSPITAL_BASED_OUTPATIENT_CLINIC_OR_DEPARTMENT_OTHER): Payer: 59

## 2020-06-05 DIAGNOSIS — C07 Malignant neoplasm of parotid gland: Secondary | ICD-10-CM | POA: Diagnosis not present

## 2020-06-13 ENCOUNTER — Ambulatory Visit (INDEPENDENT_AMBULATORY_CARE_PROVIDER_SITE_OTHER): Payer: 59

## 2020-06-13 ENCOUNTER — Other Ambulatory Visit: Payer: Self-pay

## 2020-06-13 DIAGNOSIS — Z1231 Encounter for screening mammogram for malignant neoplasm of breast: Secondary | ICD-10-CM | POA: Diagnosis not present

## 2020-06-19 ENCOUNTER — Other Ambulatory Visit: Payer: Self-pay | Admitting: Obstetrics and Gynecology

## 2020-06-19 DIAGNOSIS — R928 Other abnormal and inconclusive findings on diagnostic imaging of breast: Secondary | ICD-10-CM

## 2020-06-26 ENCOUNTER — Other Ambulatory Visit: Payer: Self-pay

## 2020-06-26 ENCOUNTER — Emergency Department (INDEPENDENT_AMBULATORY_CARE_PROVIDER_SITE_OTHER)
Admission: RE | Admit: 2020-06-26 | Discharge: 2020-06-26 | Disposition: A | Discharge status: Home / Self Care | Payer: 59 | Source: Ambulatory Visit

## 2020-06-26 ENCOUNTER — Other Ambulatory Visit: Payer: Self-pay | Admitting: Family Medicine

## 2020-06-26 VITALS — BP 141/97 | HR 83 | Temp 98.0°F | Resp 16

## 2020-06-26 DIAGNOSIS — J029 Acute pharyngitis, unspecified: Secondary | ICD-10-CM

## 2020-06-26 LAB — POCT RAPID STREP A (OFFICE): Rapid Strep A Screen: NEGATIVE

## 2020-06-26 MED ORDER — AMOXICILLIN 875 MG PO TABS: 875.0000 mg | ORAL_TABLET | Freq: Two times a day (BID) | ORAL | 0 refills | Status: DC

## 2020-06-26 MED ORDER — FLUCONAZOLE 150 MG PO TABS: 150.0000 mg | ORAL_TABLET | Freq: Once | ORAL | 0 refills | Status: DC

## 2020-06-26 MED FILL — AMOXICILLIN 875 MG TABS: 875 | 10 days supply | Qty: 20 | Fill #0

## 2020-06-26 MED FILL — FLUCONAZOLE 150 MG TABS: 150 | 2 days supply | Qty: 2 | Fill #0

## 2020-06-26 NOTE — Discharge Instructions (Addendum)
This has the features of strep despite the negative rapid test here.  No work until [employer] comes back.

## 2020-06-26 NOTE — ED Triage Notes (Signed)
Patient presents to Urgent Care with complaints of sore throat since yesterday. Patient reports she got a pcr test this morning through health at work, has been having nasal congestion for the past month.

## 2020-06-26 NOTE — ED Provider Notes (Signed)
[name]    CSN: [id number] Arrival date & time: 06/26/20  1632      History   Chief Complaint Chief Complaint  Patient presents with  . Appointment  . Sore Throat    HPI [name] [name] is a 44 y.o. female.   This is the initial [facility] urgent care visit for this 44 year old woman complaining of sore throat.  Patient presents to Urgent Care with complaints of sore throat since yesterday. Patient reports she got a pcr test this morning through health at work, has been having nasal congestion for the past month.   Patient works at [employer] for [employer].     Past Medical History:  Diagnosis Date  . Bulging lumbar disc   . 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 Complication of anesthesia   . Depression   . History of MRSA infection "years ago"   axilla  . Hypertension    under control with med., has been on med. x 5 yr.  Marland Kitchen PONV (postoperative nausea and vomiting)     Patient Active Problem List   Diagnosis Date Noted  . Hip mass, left 12/08/2017  . Plantar fasciitis of left foot 07/15/2017  . Right foot injury 05/07/2016  . Right leg pain 01/28/2016  . Anxiety 11/19/2015  . Spinal stenosis of lumbar region without neurogenic claudication 11/19/2015  . TMJ (temporomandibular joint disorder) 11/19/2015  . OAB (overactive bladder) 11/19/2015  . Essential hypertension 11/19/2015  . 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 CESAREAN SECTION    . LIPOMA EXCISION Left 05/31/2018   Procedure: EXCISION OF LEFT HIP LIPOMA;  Surgeon: Greer Pickerel, MD;  Location: [facility];  Service: General;  Laterality: Left;  Marland Kitchen MICRODISCECTOMY LUMBAR  06/2012   L5  . PAROTIDECTOMY Left 05/09/2013   Procedure: LEFT SUPERFICIAL PAROTIDECTOMY;  Surgeon: Jodi Marble, MD;  Location: [facility];  Service:  ENT;  Laterality: Left;  . PAROTIDECTOMY Left 05/16/2013   Procedure: COMPLETION PAROTIDECTOMY LEFT WITH FACIAL  NERVE PRESERVATION ;  Surgeon: Jodi Marble, MD;  Location: [facility];  Service: ENT;  Laterality: Left;  . TUBAL LIGATION    . URETHRAL SLING  2016    OB History   No obstetric history on file.      Home Medications    Prior to Admission medications   Medication Sig Start Date End Date Taking? Authorizing Provider  amoxicillin (AMOXIL) 875 MG tablet Take 1 tablet (875 mg total) by mouth 2 (two) times daily. Take with food 06/26/20  Yes Robyn Haber, MD  fluconazole (DIFLUCAN) 150 MG tablet Take 1 tablet (150 mg total) by mouth once for 1 dose. Repeat if needed 06/26/20 06/26/20 Yes Robyn Haber, MD  cholecalciferol (VITAMIN D) 1000 UNITS tablet Take 1,000 Units by mouth daily.    [provider]  citalopram (CELEXA) 20 MG tablet Take 20 mg by mouth daily.     [provider]  ibuprofen (ADVIL,MOTRIN) 200 MG tablet Take 600 mg by mouth every 8 (eight) hours as needed (for pain.).    [provider]  lisinopril (PRINIVIL,ZESTRIL) 20 MG tablet Take 20 mg by mouth daily.    [provider]    Family History Family History  Problem Relation Age of Onset  . Breast cancer Mother   . Uterine cancer Mother   .  Diabetes Mother     Social History Social History   Tobacco Use  . Smoking status: Former Games developer  . Smokeless tobacco: Never Used  . Tobacco comment: quit smoking 2007  Vaping Use  . Vaping Use: Never used  Substance Use Topics  . Alcohol use: Yes    Comment: rarely  . Drug use: No     Allergies   Keflex [cephalexin] and Sulfa antibiotics   Review of Systems Review of Systems  Constitutional: Negative.   HENT: Positive for congestion and sore throat.      Physical Exam Triage Vital Signs ED Triage Vitals [06/26/20 1640]  Enc Vitals Group     BP      Pulse      Resp      Temp       Temp src      SpO2      Weight      Height      Head Circumference      Peak Flow      Pain Score 5     Pain Loc      Pain Edu?      Excl. in GC?    No data found.  Updated Vital Signs   Physical Exam Vitals and nursing note reviewed.  Constitutional:      Appearance: She is well-developed. She is obese.  HENT:     Head: Normocephalic.     Mouth/Throat:     Mouth: Mucous membranes are moist.     Pharynx: Oropharyngeal exudate and posterior oropharyngeal erythema present.     Tonsils: Tonsillar exudate present.  Eyes:     Conjunctiva/sclera: Conjunctivae normal.  Cardiovascular:     Rate and Rhythm: Normal rate.  Pulmonary:     Effort: Pulmonary effort is normal.  Musculoskeletal:     Cervical back: Normal range of motion and neck supple.  Skin:    General: Skin is warm.  Neurological:     General: No focal deficit present.     Mental Status: She is alert.  Psychiatric:        Mood and Affect: Mood normal.      UC Treatments / Results  Labs (all labs ordered are listed, but only abnormal results are displayed) Labs Reviewed  STREP A DNA PROBE  POCT RAPID STREP A (OFFICE)    EKG   Radiology No results found.  Procedures Procedures (including critical care time)  Medications Ordered in UC Medications - No data to display  Initial Impression / Assessment and Plan / UC Course  I have reviewed the triage vital signs and the nursing notes.  Pertinent labs & imaging results that were available during my care of the patient were reviewed by me and considered in my medical decision making (see chart for details).     Final Clinical Impressions(s) / UC Diagnoses   Final diagnoses:  Pharyngitis, unspecified etiology     Discharge Instructions     This has the features of strep despite the negative rapid test here.  No work until [employer] comes back.    ED Prescriptions    Medication Sig Dispense Auth. Provider   amoxicillin (AMOXIL) 875 MG  tablet Take 1 tablet (875 mg total) by mouth 2 (two) times daily. Take with food 20 tablet Elvina Sidle, MD   fluconazole (DIFLUCAN) 150 MG tablet Take 1 tablet (150 mg total) by mouth once for 1 dose. Repeat if needed 2 tablet Elvina Sidle, MD  I have reviewed the PDMP during this encounter.   Robyn Haber, MD 06/26/20 [phone]

## 2020-06-27 LAB — STREP A DNA PROBE: Group A Strep Probe: NOT DETECTED

## 2020-07-02 DIAGNOSIS — D225 Melanocytic nevi of trunk: Secondary | ICD-10-CM | POA: Diagnosis not present

## 2020-07-02 DIAGNOSIS — Z86018 Personal history of other benign neoplasm: Secondary | ICD-10-CM | POA: Diagnosis not present

## 2020-07-02 DIAGNOSIS — L578 Other skin changes due to chronic exposure to nonionizing radiation: Secondary | ICD-10-CM | POA: Diagnosis not present

## 2020-07-02 DIAGNOSIS — L814 Other melanin hyperpigmentation: Secondary | ICD-10-CM | POA: Diagnosis not present

## 2020-07-02 DIAGNOSIS — D2372 Other benign neoplasm of skin of left lower limb, including hip: Secondary | ICD-10-CM | POA: Diagnosis not present

## 2020-07-02 DIAGNOSIS — L813 Cafe au lait spots: Secondary | ICD-10-CM | POA: Diagnosis not present

## 2020-07-04 ENCOUNTER — Ambulatory Visit
Admission: RE | Admit: 2020-07-04 | Discharge: 2020-07-04 | Disposition: A | Discharge status: Home / Self Care | Payer: 59 | Source: Ambulatory Visit | Attending: Obstetrics and Gynecology | Admitting: Obstetrics and Gynecology

## 2020-07-04 DIAGNOSIS — R928 Other abnormal and inconclusive findings on diagnostic imaging of breast: Secondary | ICD-10-CM

## 2020-07-04 DIAGNOSIS — N6489 Other specified disorders of breast: Secondary | ICD-10-CM | POA: Diagnosis not present

## 2020-07-09 MED FILL — LISINOPRIL 20 MG TABS: 20 | 90 days supply | Qty: 90 | Fill #3

## 2020-07-23 ENCOUNTER — Other Ambulatory Visit (HOSPITAL_BASED_OUTPATIENT_CLINIC_OR_DEPARTMENT_OTHER): Payer: Self-pay | Admitting: Emergency Medicine

## 2020-07-23 DIAGNOSIS — Z20828 Contact with and (suspected) exposure to other viral communicable diseases: Secondary | ICD-10-CM | POA: Diagnosis not present

## 2020-07-23 MED FILL — PSEUDOEPH-BROMPHEN-DM 30-2-: 30-2-10 | 2 days supply | Qty: 118 | Fill #0

## 2020-07-26 DIAGNOSIS — Z20828 Contact with and (suspected) exposure to other viral communicable diseases: Secondary | ICD-10-CM | POA: Diagnosis not present

## 2020-07-29 DIAGNOSIS — Z20828 Contact with and (suspected) exposure to other viral communicable diseases: Secondary | ICD-10-CM | POA: Diagnosis not present

## 2020-08-24 ENCOUNTER — Emergency Department (INDEPENDENT_AMBULATORY_CARE_PROVIDER_SITE_OTHER)
Admission: RE | Admit: 2020-08-24 | Discharge: 2020-08-24 | Disposition: A | Discharge status: Home / Self Care | Payer: 59 | Source: Ambulatory Visit | Attending: Internal Medicine | Admitting: Internal Medicine

## 2020-08-24 ENCOUNTER — Other Ambulatory Visit: Payer: Self-pay | Admitting: Internal Medicine

## 2020-08-24 ENCOUNTER — Other Ambulatory Visit: Payer: Self-pay

## 2020-08-24 VITALS — BP 121/83 | HR 95 | Temp 98.3°F | Resp 16

## 2020-08-24 DIAGNOSIS — N3001 Acute cystitis with hematuria: Secondary | ICD-10-CM

## 2020-08-24 DIAGNOSIS — B354 Tinea corporis: Secondary | ICD-10-CM

## 2020-08-24 LAB — POCT URINALYSIS DIP (MANUAL ENTRY)
Bilirubin, UA: NEGATIVE
Glucose, UA: NEGATIVE mg/dL
Ketones, POC UA: NEGATIVE mg/dL
Nitrite, UA: NEGATIVE
Protein Ur, POC: >=300 mg/dL — AB
Spec Grav, UA: 1.02 (ref 1.010–1.025)
Urobilinogen, UA: 0.2 E.U./dL
pH, UA: 7.5 (ref 5.0–8.0)

## 2020-08-24 MED ORDER — NITROFURANTOIN MONOHYD MACRO 100 MG PO CAPS: 100.0000 mg | ORAL_CAPSULE | Freq: Two times a day (BID) | ORAL | 0 refills | Status: DC

## 2020-08-24 MED ORDER — TERBINAFINE HCL 250 MG PO TABS: 250.0000 mg | ORAL_TABLET | Freq: Every day | ORAL | 0 refills | Status: DC

## 2020-08-24 MED FILL — TERBINAFINE HCL 250 MG TAB: 250 | 10 days supply | Qty: 10 | Fill #0

## 2020-08-24 MED FILL — NITROFURANTOIN MONO-MCR 100: 100 | 5 days supply | Qty: 10 | Fill #0

## 2020-08-24 NOTE — Discharge Instructions (Signed)
Please use medications as directed We will send your urine for cultures and if the culture results requiring changes in antibiotics we will give you a call

## 2020-08-24 NOTE — ED Provider Notes (Signed)
[facility]    CSN: [id number] Arrival date & time: 08/24/20  7001      History   Chief Complaint Chief Complaint  Patient presents with  . Dysuria    appoint  . Appointment  . Rash    HPI [name] [name] is a 45 y.o. female who comes to urgent care with complaints of dysuria, urgency and frequency of 1 day duration. Symptom onset was sudden and has been persistent. No flank pain. No fever or chills. No vaginal discharge. No abdominal pain, nausea or vomiting.  Patient complains of itchy rash over the right popliteal area. Onset was insidious and has been persistent for 2 weeks. No discharge from the rash. Patient has tried over-the-counter clotrimazole with no improvement in symptoms. No fever or chills.Marland Kitchen   HPI  Past Medical History:  Diagnosis Date  . Bulging lumbar disc   . 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 Complication of anesthesia   . Depression   . History of MRSA infection "years ago"   axilla  . Hypertension    under control with med., has been on med. x 5 yr.  Marland Kitchen PONV (postoperative nausea and vomiting)     Patient Active Problem List   Diagnosis Date Noted  . Hip mass, left 12/08/2017  . Plantar fasciitis of left foot 07/15/2017  . Right foot injury 05/07/2016  . Right leg pain 01/28/2016  . Anxiety 11/19/2015  . Spinal stenosis of lumbar region without neurogenic claudication 11/19/2015  . TMJ (temporomandibular joint disorder) 11/19/2015  . OAB (overactive bladder) 11/19/2015  . Essential hypertension 11/19/2015  . 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 CESAREAN SECTION    . LIPOMA EXCISION Left 05/31/2018   Procedure: EXCISION OF LEFT HIP LIPOMA;  Surgeon: Greer Pickerel, MD;  Location: [place];  Service: General;  Laterality: Left;  Marland Kitchen MICRODISCECTOMY LUMBAR  06/2012   L5  . PAROTIDECTOMY Left  05/09/2013   Procedure: LEFT SUPERFICIAL PAROTIDECTOMY;  Surgeon: Jodi Marble, MD;  Location: [facility];  Service: ENT;  Laterality: Left;  . PAROTIDECTOMY Left 05/16/2013   Procedure: COMPLETION PAROTIDECTOMY LEFT WITH FACIAL  NERVE PRESERVATION ;  Surgeon: Jodi Marble, MD;  Location: [place];  Service: ENT;  Laterality: Left;  . TUBAL LIGATION    . URETHRAL SLING  2016    OB History   No obstetric history on file.      Home Medications    Prior to Admission medications   Medication Sig Start Date End Date Taking? Authorizing Provider  cholecalciferol (VITAMIN D) 1000 UNITS tablet Take 1,000 Units by mouth daily.   Yes [provider]  citalopram (CELEXA) 20 MG tablet Take 20 mg by mouth daily.    Yes [provider]  lisinopril (PRINIVIL,ZESTRIL) 20 MG tablet Take 20 mg by mouth daily.   Yes [provider]  nitrofurantoin, macrocrystal-monohydrate, (MACROBID) 100 MG capsule Take 1 capsule (100 mg total) by mouth 2 (two) times daily. 08/24/20  Yes Breonna Gafford, Myrene Galas, MD  terbinafine (LAMISIL) 250 MG tablet Take 1 tablet (250 mg total) by mouth daily for 10 days. 08/24/20 09/03/20 Yes Laterrance Nauta, Myrene Galas, MD  ibuprofen (ADVIL,MOTRIN) 200 MG tablet Take 600 mg by mouth every 8 (eight) hours as needed (for pain.).    [provider]    Family History  Family History  Problem Relation Age of Onset  . Breast cancer Mother   . Uterine cancer Mother   . Diabetes Mother     Social History Social History   Tobacco Use  . Smoking status: Former Research scientist (life sciences)  . Smokeless tobacco: Never Used  . Tobacco comment: quit smoking 2007  Vaping Use  . Vaping Use: Never used  Substance Use Topics  . Alcohol use: Yes    Comment: rarely  . Drug use: No     Allergies   Keflex [cephalexin] and Sulfa antibiotics   Review of Systems Review of Systems  HENT: Negative.   Respiratory: Negative.   Genitourinary: Positive for  dysuria, frequency and urgency. Negative for vaginal discharge.  Neurological: Negative.      Physical Exam Triage Vital Signs ED Triage Vitals  Enc Vitals Group     BP 08/24/20 0954 121/83     Pulse Rate 08/24/20 0954 95     Resp 08/24/20 0954 16     Temp 08/24/20 0954 98.3 F (36.8 C)     Temp Source 08/24/20 0954 Oral     SpO2 08/24/20 0954 99 %     Weight --      Height --      Head Circumference --      Peak Flow --      Pain Score 08/24/20 0956 3     Pain Loc --      Pain Edu? --      Excl. in [place]? --    No data found.  Updated Vital Signs BP 121/83 (BP Location: Right Arm)   Pulse 95   Temp 98.3 F (36.8 C) (Oral)   Resp 16   LMP 08/10/2020 (Exact Date)   SpO2 99%   Visual Acuity Right Eye Distance:   Left Eye Distance:   Bilateral Distance:    Right Eye Near:   Left Eye Near:    Bilateral Near:     Physical Exam Vitals and nursing note reviewed.  Constitutional:      General: She is not in acute distress.    Appearance: She is not ill-appearing.  Cardiovascular:     Rate and Rhythm: Normal rate and regular rhythm.     Pulses: Normal pulses.     Heart sounds: Normal heart sounds.  Pulmonary:     Effort: Pulmonary effort is normal.     Breath sounds: Normal breath sounds.  Abdominal:     General: Bowel sounds are normal.     Palpations: Abdomen is soft.  Skin:    General: Skin is warm and dry.     Capillary Refill: Capillary refill takes less than 2 seconds.     Findings: Rash present.     Comments: Mildly erythematous rash over the right popliteal area. Rash has some central clearing and measures about 2 inches in longest diameter. No discharge from the rash. No signs of excoriations.  Neurological:     Mental Status: She is alert.      UC Treatments / Results  Labs (all labs ordered are listed, but only abnormal results are displayed) Labs Reviewed  POCT URINALYSIS DIP (MANUAL ENTRY) - Abnormal; Notable for the following components:       Result Value   Clarity, UA cloudy (*)    Blood, UA large (*)    Protein Ur, POC >=300 (*)    Leukocytes, UA Small (1+) (*)    All other components within normal limits  URINE CULTURE  EKG   Radiology No results found.  Procedures Procedures (including critical care time)  Medications Ordered in UC Medications - No data to display  Initial Impression / Assessment and Plan / UC Course  I have reviewed the triage vital signs and the nursing notes.  Pertinent labs & imaging results that were available during my care of the patient were reviewed by me and considered in my medical decision making (see chart for details).     1. Acute cystitis with hematuria: Point-of-care urinalysis is significant for leukocyte esterase, hemoglobin and protein. Urine cultures have been sent Macrobid 100 mg twice daily for 5 days We will call patient if urine culture results are abnormal.  2. Tinea corporis: Lamisil 250 mg orally daily for 7 days Over-the-counter creams for pruritus recommended.  Return precautions given. Final Clinical Impressions(s) / UC Diagnoses   Final diagnoses:  Acute cystitis with hematuria  Tinea corporis     Discharge Instructions     Please use medications as directed We will send your urine for cultures and if the culture results requiring changes in antibiotics we will give you a call   ED Prescriptions    Medication Sig Dispense Auth. Provider   nitrofurantoin, macrocrystal-monohydrate, (MACROBID) 100 MG capsule Take 1 capsule (100 mg total) by mouth 2 (two) times daily. 10 capsule Imran Nuon, Myrene Galas, MD   terbinafine (LAMISIL) 250 MG tablet Take 1 tablet (250 mg total) by mouth daily for 10 days. 10 tablet Amaru Burroughs, Myrene Galas, MD     PDMP not reviewed this encounter.   Chase Picket, MD 08/24/20 [phone]

## 2020-08-24 NOTE — ED Triage Notes (Signed)
Dysuria since yesterday  Rash to back of RLE x 2 weeks - OTC trx w/ antfungal cream has made itching worse COVID vaccine 2/21 - no booster

## 2020-08-26 LAB — URINE CULTURE
MICRO NUMBER:: 11580063
SPECIMEN QUALITY:: ADEQUATE

## 2020-09-03 ENCOUNTER — Other Ambulatory Visit (HOSPITAL_BASED_OUTPATIENT_CLINIC_OR_DEPARTMENT_OTHER): Payer: Self-pay

## 2020-09-03 MED ORDER — CITALOPRAM HYDROBROMIDE 20 MG PO TABS
20.0000 mg | ORAL_TABLET | Freq: Every day | ORAL | 1 refills | Status: DC
  Filled 2020-09-03: qty 90, 90d supply, fill #0
  Filled 2020-12-18: qty 90, 90d supply, fill #1

## 2020-10-10 ENCOUNTER — Other Ambulatory Visit (HOSPITAL_BASED_OUTPATIENT_CLINIC_OR_DEPARTMENT_OTHER): Payer: Self-pay

## 2020-10-10 ENCOUNTER — Emergency Department (INDEPENDENT_AMBULATORY_CARE_PROVIDER_SITE_OTHER)
Admission: RE | Admit: 2020-10-10 | Discharge: 2020-10-10 | Disposition: A | Discharge status: Home / Self Care | Payer: 59 | Source: Ambulatory Visit | Attending: Family Medicine | Admitting: Family Medicine

## 2020-10-10 ENCOUNTER — Other Ambulatory Visit: Payer: Self-pay

## 2020-10-10 VITALS — BP 137/85 | HR 99 | Temp 97.8°F | Resp 17

## 2020-10-10 DIAGNOSIS — N390 Urinary tract infection, site not specified: Secondary | ICD-10-CM

## 2020-10-10 LAB — POCT URINALYSIS DIP (MANUAL ENTRY)
Bilirubin, UA: NEGATIVE
Glucose, UA: NEGATIVE mg/dL
Ketones, POC UA: NEGATIVE mg/dL
Nitrite, UA: NEGATIVE
Protein Ur, POC: 30 mg/dL — AB
Spec Grav, UA: 1.015 (ref 1.010–1.025)
Urobilinogen, UA: 0.2 E.U./dL
pH, UA: 6 (ref 5.0–8.0)

## 2020-10-10 MED ORDER — NITROFURANTOIN MONOHYD MACRO 100 MG PO CAPS
100.0000 mg | ORAL_CAPSULE | Freq: Two times a day (BID) | ORAL | 0 refills | Status: DC
  Filled 2020-10-10: qty 10, 5d supply, fill #0

## 2020-10-10 MED ORDER — PHENAZOPYRIDINE HCL 200 MG PO TABS
200.0000 mg | ORAL_TABLET | Freq: Three times a day (TID) | ORAL | 0 refills | Status: DC
Start: 2020-10-10 — End: 2020-10-15
  Filled 2020-10-10: qty 6, 2d supply, fill #0

## 2020-10-10 NOTE — ED Provider Notes (Signed)
[name]    CSN: [id number] Arrival date & time: 10/10/20  0935      History   Chief Complaint Chief Complaint  Patient presents with  . Dysuria  . Appointment    10am     HPI [name] [name] is a 45 y.o. female.   HPI   Patient was awakened at 3 AM with dysuria and frequency.  She feels like she has a bladder infection.  She has not had one for a long time.  She has no abdominal pain, flank pain, fever or chills.  No history of kidney stones or kidney infections.  She is certain that she is not pregnant.  She has not taken any medicine for her symptoms  Past Medical History:  Diagnosis Date  . Bulging lumbar disc   . 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 Complication of anesthesia   . Depression   . History of MRSA infection "years ago"   axilla  . Hypertension    under control with med., has been on med. x 5 yr.  Marland Kitchen PONV (postoperative nausea and vomiting)     Patient Active Problem List   Diagnosis Date Noted  . Hip mass, left 12/08/2017  . Plantar fasciitis of left foot 07/15/2017  . Right foot injury 05/07/2016  . Right leg pain 01/28/2016  . Anxiety 11/19/2015  . Spinal stenosis of lumbar region without neurogenic claudication 11/19/2015  . TMJ (temporomandibular joint disorder) 11/19/2015  . OAB (overactive bladder) 11/19/2015  . Essential hypertension 11/19/2015  . 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 CESAREAN SECTION    . LIPOMA EXCISION Left 05/31/2018   Procedure: EXCISION OF LEFT HIP LIPOMA;  Surgeon: Greer Pickerel, MD;  Location: [facility];  Service: General;  Laterality: Left;  Marland Kitchen MICRODISCECTOMY LUMBAR  06/2012   L5  . PAROTIDECTOMY Left 05/09/2013   Procedure: LEFT SUPERFICIAL PAROTIDECTOMY;  Surgeon: Jodi Marble, MD;  Location: [place];  Service: ENT;  Laterality: Left;   . PAROTIDECTOMY Left 05/16/2013   Procedure: COMPLETION PAROTIDECTOMY LEFT WITH FACIAL  NERVE PRESERVATION ;  Surgeon: Jodi Marble, MD;  Location: [place];  Service: ENT;  Laterality: Left;  . TUBAL LIGATION    . URETHRAL SLING  2016    OB History   No obstetric history on file.      Home Medications    Prior to Admission medications   Medication Sig Start Date End Date Taking? Authorizing Provider  phenazopyridine (PYRIDIUM) 200 MG tablet Take 1 tablet (200 mg total) by mouth 3 (three) times daily. 10/10/20  Yes Raylene Everts, MD  cholecalciferol (VITAMIN D) 1000 UNITS tablet Take 1,000 Units by mouth daily.    [provider]  citalopram (CELEXA) 20 MG tablet Take 1 tablet (20 mg total) by mouth daily. 02/09/20     ibuprofen (ADVIL,MOTRIN) 200 MG tablet Take 600 mg by mouth every 8 (eight) hours as needed (for pain.).    [provider]  lisinopril (ZESTRIL) 20 MG tablet TAKE 1 TABLET BY MOUTH ONCE DAILY 08/26/19 08/25/20  Rubie Maid, MD  nitrofurantoin, macrocrystal-monohydrate, (MACROBID) 100 MG capsule Take 1 capsule (100 mg total) by mouth 2 (two) times daily for 10 doses. 10/10/20 10/15/20  Raylene Everts, MD  terbinafine (LAMISIL) 250 MG tablet TAKE 1 TABLET BY MOUTH ONCE DAILY  FOR 10 DAYS 08/24/20 08/24/21  Chase Picket, MD    Family History Family History  Problem Relation Age of Onset  . Breast cancer Mother   . Uterine cancer Mother   . Diabetes Mother     Social History Social History   Tobacco Use  . Smoking status: Former Research scientist (life sciences)  . Smokeless tobacco: Never Used  . Tobacco comment: quit smoking 2007  Vaping Use  . Vaping Use: Never used  Substance Use Topics  . Alcohol use: Yes    Comment: rarely  . Drug use: No     Allergies   Keflex [cephalexin] and Sulfa antibiotics   Review of Systems Review of Systems See HPI  Physical Exam Triage Vital Signs ED Triage Vitals  Enc Vitals Group     BP  10/10/20 0950 137/85     Pulse Rate 10/10/20 0950 99     Resp 10/10/20 0947 17     Temp 10/10/20 0950 97.8 F (36.6 C)     Temp Source 10/10/20 0947 Oral     SpO2 10/10/20 0950 95 %     Weight --      Height --      Head Circumference --      Peak Flow --      Pain Score 10/10/20 0951 4     Pain Loc --      Pain Edu? --      Excl. in [place]? --    No data found.  Updated Vital Signs BP 137/85   Pulse 99   Temp 97.8 F (36.6 C) (Oral)   Resp 17   LMP 09/19/2020 (Approximate)   SpO2 95%      Physical Exam Constitutional:      General: She is not in acute distress.    Appearance: Normal appearance. She is well-developed.  HENT:     Head: Normocephalic and atraumatic.     Mouth/Throat:     Comments: Mask is in place Eyes:     Conjunctiva/sclera: Conjunctivae normal.     Pupils: Pupils are equal, round, and reactive to light.  Cardiovascular:     Rate and Rhythm: Normal rate.  Pulmonary:     Effort: Pulmonary effort is normal. No respiratory distress.  Abdominal:     General: There is no distension.     Palpations: Abdomen is soft.     Comments: Denies flank pain  Musculoskeletal:        General: Normal range of motion.     Cervical back: Normal range of motion.  Skin:    General: Skin is warm and dry.  Neurological:     Mental Status: She is alert.      UC Treatments / Results  Labs (all labs ordered are listed, but only abnormal results are displayed) Labs Reviewed  POCT URINALYSIS DIP (MANUAL ENTRY) - Abnormal; Notable for the following components:      Result Value   Clarity, UA cloudy (*)    Blood, UA moderate (*)    Protein Ur, POC =30 (*)    Leukocytes, UA Small (1+) (*)    All other components within normal limits  URINE CULTURE    EKG   Radiology No results found.  Procedures Procedures (including critical care time)  Medications Ordered in UC Medications - No data to display  Initial Impression / Assessment and Plan / UC Course  I  have reviewed the triage vital signs and the nursing notes.  Pertinent labs &  imaging results that were available during my care of the patient were reviewed by me and considered in my medical decision making (see chart for details).     Likely cystitis.  We will do urine culture.  Cover with antibiotics Final Clinical Impressions(s) / UC Diagnoses   Final diagnoses:  Lower urinary tract infectious disease     Discharge Instructions     Take antibiotic Macrobid (nitrofurantoin) 2 times a day.  Get 2 doses in today. Take Pyridium as needed for urinary discomfort.  This will make your urine orange Drink lots of water  Check MyChart for your culture result.  You will be called if any change in antibiotic is indicated   ED Prescriptions    Medication Sig Dispense Auth. Provider   nitrofurantoin, macrocrystal-monohydrate, (MACROBID) 100 MG capsule Take 1 capsule (100 mg total) by mouth 2 (two) times daily for 10 doses. 10 capsule Raylene Everts, MD   phenazopyridine (PYRIDIUM) 200 MG tablet Take 1 tablet (200 mg total) by mouth 3 (three) times daily. 6 tablet Raylene Everts, MD     PDMP not reviewed this encounter.   Raylene Everts, MD 10/10/20 [phone]

## 2020-10-10 NOTE — ED Triage Notes (Signed)
Pt c/o dysuria and urinary frequency since 3 am this morning. No Azo taken.Pain 4/10

## 2020-10-10 NOTE — Discharge Instructions (Addendum)
Take antibiotic Macrobid (nitrofurantoin) 2 times a day.  Get 2 doses in today. Take Pyridium as needed for urinary discomfort.  This will make your urine orange Drink lots of water  Check MyChart for your culture result.  You will be called if any change in antibiotic is indicated

## 2020-10-11 ENCOUNTER — Other Ambulatory Visit (HOSPITAL_BASED_OUTPATIENT_CLINIC_OR_DEPARTMENT_OTHER): Payer: Self-pay

## 2020-10-12 LAB — URINE CULTURE
MICRO NUMBER:: 11765605
SPECIMEN QUALITY:: ADEQUATE

## 2020-10-15 ENCOUNTER — Other Ambulatory Visit (HOSPITAL_BASED_OUTPATIENT_CLINIC_OR_DEPARTMENT_OTHER): Payer: Self-pay

## 2020-10-15 ENCOUNTER — Ambulatory Visit (HOSPITAL_BASED_OUTPATIENT_CLINIC_OR_DEPARTMENT_OTHER): Payer: 59 | Admitting: Family Medicine

## 2020-10-15 ENCOUNTER — Encounter (HOSPITAL_BASED_OUTPATIENT_CLINIC_OR_DEPARTMENT_OTHER): Payer: Self-pay | Admitting: Family Medicine

## 2020-10-15 ENCOUNTER — Other Ambulatory Visit: Payer: Self-pay

## 2020-10-15 VITALS — BP 140/70 | HR 79 | Ht 66.0 in | Wt 277.4 lb

## 2020-10-15 DIAGNOSIS — Z6841 Body Mass Index (BMI) 40.0 and over, adult: Secondary | ICD-10-CM | POA: Diagnosis not present

## 2020-10-15 DIAGNOSIS — Z7689 Persons encountering health services in other specified circumstances: Secondary | ICD-10-CM | POA: Diagnosis not present

## 2020-10-15 DIAGNOSIS — E785 Hyperlipidemia, unspecified: Secondary | ICD-10-CM | POA: Diagnosis not present

## 2020-10-15 DIAGNOSIS — I10 Essential (primary) hypertension: Secondary | ICD-10-CM | POA: Diagnosis not present

## 2020-10-15 DIAGNOSIS — R7309 Other abnormal glucose: Secondary | ICD-10-CM | POA: Diagnosis not present

## 2020-10-15 MED ORDER — LISINOPRIL 20 MG PO TABS
20.0000 mg | ORAL_TABLET | Freq: Every day | ORAL | 0 refills | Status: DC
  Filled 2020-10-15: qty 90, 90d supply, fill #0

## 2020-10-15 NOTE — Assessment & Plan Note (Signed)
Observed on prior labs, will repeat labs today Discussed lifestyle modifications including diet and exercise

## 2020-10-15 NOTE — Assessment & Plan Note (Signed)
Chronic, borderline control in office today, controlled per home readings Continue with current dose of lisinopril, refilled today Check labs as below Continue to monitor blood pressure at home/work Recommend lifestyle modifications including low-salt diet, regular physical activity

## 2020-10-15 NOTE — Patient Instructions (Signed)
  Medication Instructions:  Your physician recommends that you continue on your current medications as directed. Please refer to the Current Medication list given to you today. --If you need a refill on any your medications before your next appointment, please call your pharmacy first. If no refills are authorized on file call the office.--  Lab Work: Your physician has recommended that you have lab work today: CBC, Comprehensive Metabolic Panel, HgB I4P, and Lipid Profile If you have labs (blood work) drawn today and your tests are completely normal, you will receive your results only by: Marland Kitchen MyChart Message (if you have MyChart) OR . A phone call from our staff. Please ensure you check your voicemail in the event that you authorized detailed messages to be left on a delegated number. If you have any lab test that is abnormal or we need to change your treatment, we will call you to review the results.  Follow-Up: Your next appointment:   Your physician recommends that you schedule a follow-up appointment in: 2-3 MONTHS with Dr. [place]  Thanks for letting us be apart of your health journey!!  Primary Care and Sports Medicine   Dr. de [place] and Worthy Keeler, DNP, AGNP  We recommend signing up for the patient portal called "MyChart".  Sign up information is provided on this After Visit Summary.  MyChart is used to connect with patients for Virtual Visits (Telemedicine).  Patients are able to view lab/test results, encounter notes, upcoming appointments, etc.  Non-urgent messages can be sent to your provider as well.   To learn more about what you can do with MyChart, go to [email or web address].

## 2020-10-15 NOTE — Progress Notes (Signed)
New Patient Office Visit  Subjective:  Patient ID: [name] [name], female    DOB: January 24, 1976  Age: 45 y.o. MRN: [id number]  CC:  Chief Complaint  Patient presents with  . Establish Care  . Medication Refill    Patient needs renewal of HTN medication    HPI [name] [name] is a 45 year old female presenting to establish in clinic.  She reports past medical history of high blood pressure, vitamin D deficiency, anxiety.  Denies any specific concerns today.  High blood pressure: Reports that she developed gestational hypertension during her last pregnancy and this persisted after birth of her child.  She has been on lisinopril for many years.  Denies any issues with this medication.  Denies any issues with chest pain, shortness of breath, lightheadedness, dizziness, headaches.  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  Needing refill of lisinopril today.  Anxiety/depression: Reports that this is being treated by her OB/GYN who started her on citalopram 20 mg.  She has not taken this medication for several years.  Denies any present concerns or issues.  PHQ-9 completed today with score of 0.  Past Medical History:  Diagnosis Date  . Bulging lumbar disc   . 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 Complication of anesthesia   . Depression   . History of MRSA infection "years ago"   axilla  . Hypertension    under control with med., has been on med. x 5 yr.  Marland Kitchen PONV (postoperative nausea and vomiting)     Past Surgical History:  Procedure Laterality Date  . CESAREAN SECTION    . LIPOMA EXCISION Left 05/31/2018   Procedure: EXCISION OF LEFT HIP LIPOMA;  Surgeon: Greer Pickerel, MD;  Location: [facility];  Service: General;  Laterality: Left;  Marland Kitchen MICRODISCECTOMY LUMBAR  06/2012   L5  . PAROTIDECTOMY Left 05/09/2013   Procedure: LEFT SUPERFICIAL PAROTIDECTOMY;  Surgeon: Jodi Marble, MD;  Location: [facility];   Service: ENT;  Laterality: Left;  . PAROTIDECTOMY Left 05/16/2013   Procedure: COMPLETION PAROTIDECTOMY LEFT WITH FACIAL  NERVE PRESERVATION ;  Surgeon: Jodi Marble, MD;  Location: [facility];  Service: ENT;  Laterality: Left;  . TUBAL LIGATION    . URETHRAL SLING  2016    Family History  Problem Relation Age of Onset  . Breast cancer Mother   . Uterine cancer Mother   . Diabetes Mother     Social History   Socioeconomic History  . Marital status: Married    Spouse name: Not on file  . Number of children: Not on file  . Years of education: Not on file  . Highest education level: Not on file  Occupational History  . Not on file  Tobacco Use  . Smoking status: Former Research scientist (life sciences)  . Smokeless tobacco: Never Used  . Tobacco comment: quit smoking 2007  Vaping Use  . Vaping Use: Never used  Substance and Sexual Activity  . Alcohol use: Yes    Comment: rarely  . Drug use: No  . Sexual activity: Yes    Birth control/protection: None, Surgical    Comment: BTL  Other Topics Concern  . Not on file  Social History Narrative  . Not on file   Social Determinants of Health   Financial Resource Strain: Not on file  Food Insecurity: Not on file  Transportation Needs: Not on file  Physical Activity:  Not on file  Stress: Not on file  Social Connections: Not on file  Intimate Partner Violence: Not on file    Objective:   Today's Vitals: BP 140/70   Pulse 79   Ht 5\' 6"  (1.676 m)   Wt 277 lb 6.4 oz (125.8 kg)   LMP 09/19/2020 (Approximate)   SpO2 97%   BMI 44.77 kg/m   Physical Exam  Pleasant 45 year old female in no acute distress Cardiovascular exam with regular rate and rhythm, no murmurs appreciated Lungs clear to auscultation bilaterally  Assessment & Plan:   Problem List Items Addressed This Visit      Cardiovascular and Mediastinum   Essential hypertension - Primary    Chronic, borderline control in office today, controlled per home  readings Continue with current dose of lisinopril, refilled today Check labs as below Continue to monitor blood pressure at home/work Recommend lifestyle modifications including low-salt diet, regular physical activity      Relevant Medications   lisinopril (ZESTRIL) 20 MG tablet   Other Relevant Orders   CBC with Differential/Platelet   Comprehensive metabolic panel   Lipid panel     Other   Body mass index (BMI) 40.0-44.9, adult [facility])    Patient aware of role elevated BMI plays in contributing to other conditions and risk of developing chronic medical conditions Interested in increasing level of physical activity, handout provided, discussed benefits Discussed option of referral to nutritionist, she will consider Discussed option of bariatric surgery, patient not interested at this time Provided handout reviewing gradual increase in daily physical activity Continue to reassess at future visits, monitor weight at future visits      Relevant Orders   CBC with Differential/Platelet   Comprehensive metabolic panel   Hemoglobin A1c   Lipid panel   Dyslipidemia    Observed on prior labs, will repeat labs today Discussed lifestyle modifications including diet and exercise      Relevant Orders   CBC with Differential/Platelet   Comprehensive metabolic panel   Hemoglobin A1c   Lipid panel    Other Visit Diagnoses    Elevated random blood glucose level       Relevant Orders   Hemoglobin A1c   Establishing care with new doctor, encounter for       Relevant Orders   CBC with Differential/Platelet   Comprehensive metabolic panel   Hemoglobin A1c   Lipid panel      Outpatient Encounter Medications as of 10/15/2020  Medication Sig  . cholecalciferol (VITAMIN D) 1000 UNITS tablet Take 1,000 Units by mouth daily.  . citalopram (CELEXA) 20 MG tablet Take 1 tablet (20 mg total) by mouth daily.  Marland Kitchen ibuprofen (ADVIL,MOTRIN) 200 MG tablet Take 600 mg by mouth every 8 (eight) hours  as needed (for pain.).  . [DISCONTINUED] lisinopril (ZESTRIL) 20 MG tablet TAKE 1 TABLET BY MOUTH ONCE DAILY  . lisinopril (ZESTRIL) 20 MG tablet Take 1 tablet (20 mg total) by mouth daily.  . [DISCONTINUED] nitrofurantoin, macrocrystal-monohydrate, (MACROBID) 100 MG capsule Take 1 capsule (100 mg total) by mouth 2 (two) times daily for 10 doses. (Patient not taking: Reported on 10/15/2020)  . [DISCONTINUED] phenazopyridine (PYRIDIUM) 200 MG tablet Take 1 tablet (200 mg total) by mouth 3 (three) times daily. (Patient not taking: Reported on 10/15/2020)  . [DISCONTINUED] terbinafine (LAMISIL) 250 MG tablet TAKE 1 TABLET BY MOUTH ONCE DAILY FOR 10 DAYS (Patient not taking: Reported on 10/15/2020)   No facility-administered encounter medications on file as of 10/15/2020.  Spent 45 minutes on this patient encounter, including preparation, chart review, face-to-face counseling with patient and coordination of care, and documentation of encounter  Follow-up: Return in about 3 months (around 01/14/2021) for Follow Up.   Lavan Imes J De [place], MD

## 2020-10-15 NOTE — Assessment & Plan Note (Signed)
Patient aware of role elevated BMI plays in contributing to other conditions and risk of developing chronic medical conditions Interested in increasing level of physical activity, handout provided, discussed benefits Discussed option of referral to nutritionist, she will consider Discussed option of bariatric surgery, patient not interested at this time Provided handout reviewing gradual increase in daily physical activity Continue to reassess at future visits, monitor weight at future visits

## 2020-10-16 ENCOUNTER — Other Ambulatory Visit (HOSPITAL_BASED_OUTPATIENT_CLINIC_OR_DEPARTMENT_OTHER)
Admission: RE | Admit: 2020-10-16 | Discharge: 2020-10-16 | Disposition: A | Discharge status: Home / Self Care | Payer: 59 | Source: Ambulatory Visit | Attending: Family Medicine | Admitting: Family Medicine

## 2020-10-16 DIAGNOSIS — E785 Hyperlipidemia, unspecified: Secondary | ICD-10-CM

## 2020-10-16 DIAGNOSIS — Z6841 Body Mass Index (BMI) 40.0 and over, adult: Secondary | ICD-10-CM | POA: Diagnosis not present

## 2020-10-16 DIAGNOSIS — I10 Essential (primary) hypertension: Secondary | ICD-10-CM | POA: Diagnosis not present

## 2020-10-16 DIAGNOSIS — R7309 Other abnormal glucose: Secondary | ICD-10-CM | POA: Insufficient documentation

## 2020-10-16 DIAGNOSIS — Z7689 Persons encountering health services in other specified circumstances: Secondary | ICD-10-CM

## 2020-10-16 DIAGNOSIS — R739 Hyperglycemia, unspecified: Secondary | ICD-10-CM

## 2020-10-16 LAB — CBC WITH DIFFERENTIAL/PLATELET
Abs Immature Granulocytes: 0.03 10*3/uL (ref 0.00–0.07)
Basophils Absolute: 0 10*3/uL (ref 0.0–0.1)
Basophils Relative: 0 %
Eosinophils Absolute: 0.2 10*3/uL (ref 0.0–0.5)
Eosinophils Relative: 2 %
HCT: 36.8 % (ref 36.0–46.0)
Hemoglobin: 11.6 g/dL — ABNORMAL LOW (ref 12.0–15.0)
Immature Granulocytes: 0 %
Lymphocytes Relative: 26 %
Lymphs Abs: 2.7 10*3/uL (ref 0.7–4.0)
MCH: 26.5 pg (ref 26.0–34.0)
MCHC: 31.5 g/dL (ref 30.0–36.0)
MCV: 84.2 fL (ref 80.0–100.0)
Monocytes Absolute: 0.7 10*3/uL (ref 0.1–1.0)
Monocytes Relative: 7 %
Neutro Abs: 6.6 10*3/uL (ref 1.7–7.7)
Neutrophils Relative %: 65 %
Platelets: 438 10*3/uL — ABNORMAL HIGH (ref 150–400)
RBC: 4.37 MIL/uL (ref 3.87–5.11)
RDW: 15.6 % — ABNORMAL HIGH (ref 11.5–15.5)
WBC: 10.3 10*3/uL (ref 4.0–10.5)
nRBC: 0 % (ref 0.0–0.2)

## 2020-10-16 LAB — COMPREHENSIVE METABOLIC PANEL
ALT: 19 U/L (ref 0–44)
AST: 15 U/L (ref 15–41)
Albumin: 4.2 g/dL (ref 3.5–5.0)
Alkaline Phosphatase: 41 U/L (ref 38–126)
Anion gap: 8 (ref 5–15)
BUN: 19 mg/dL (ref 6–20)
CO2: 28 mmol/L (ref 22–32)
Calcium: 9.1 mg/dL (ref 8.9–10.3)
Chloride: 102 mmol/L (ref 98–111)
Creatinine, Ser: 0.61 mg/dL (ref 0.44–1.00)
GFR, Estimated: >60 mL/min (ref >60)
Glucose, Bld: 90 mg/dL (ref 70–99)
Potassium: 4.2 mmol/L (ref 3.5–5.1)
Sodium: 138 mmol/L (ref 135–145)
Total Bilirubin: 0.4 mg/dL (ref 0.3–1.2)
Total Protein: 7.4 g/dL (ref 6.5–8.1)

## 2020-10-16 LAB — LIPID PANEL
Cholesterol: 223 mg/dL — ABNORMAL HIGH (ref 0–200)
HDL: 49 mg/dL (ref >40)
LDL Cholesterol: 142 mg/dL — ABNORMAL HIGH (ref 0–99)
Total CHOL/HDL Ratio: 4.6 RATIO
Triglycerides: 158 mg/dL — ABNORMAL HIGH (ref <150)
VLDL: 32 mg/dL (ref 0–40)

## 2020-10-16 LAB — HEMOGLOBIN A1C
Hgb A1c MFr Bld: 5.7 % — ABNORMAL HIGH (ref 4.8–5.6)
Mean Plasma Glucose: 116.89 mg/dL

## 2020-10-18 ENCOUNTER — Other Ambulatory Visit (HOSPITAL_BASED_OUTPATIENT_CLINIC_OR_DEPARTMENT_OTHER)
Admission: RE | Admit: 2020-10-18 | Discharge: 2020-10-18 | Disposition: A | Discharge status: Home / Self Care | Payer: 59 | Source: Ambulatory Visit | Attending: Family Medicine | Admitting: Family Medicine

## 2020-10-18 ENCOUNTER — Other Ambulatory Visit (HOSPITAL_BASED_OUTPATIENT_CLINIC_OR_DEPARTMENT_OTHER): Payer: Self-pay | Admitting: Family Medicine

## 2020-10-18 ENCOUNTER — Other Ambulatory Visit (HOSPITAL_BASED_OUTPATIENT_CLINIC_OR_DEPARTMENT_OTHER): Payer: Self-pay

## 2020-10-18 ENCOUNTER — Telehealth (HOSPITAL_BASED_OUTPATIENT_CLINIC_OR_DEPARTMENT_OTHER): Payer: Self-pay

## 2020-10-18 DIAGNOSIS — D75839 Thrombocytosis, unspecified: Secondary | ICD-10-CM

## 2020-10-18 DIAGNOSIS — D649 Anemia, unspecified: Secondary | ICD-10-CM

## 2020-10-18 LAB — IRON AND TIBC
Iron: 37 ug/dL (ref 28–170)
Saturation Ratios: 9 % — ABNORMAL LOW (ref 10.4–31.8)
TIBC: 434 ug/dL (ref 250–450)
UIBC: 397 ug/dL

## 2020-10-18 LAB — FERRITIN: Ferritin: 14 ng/mL (ref 11–307)

## 2020-10-18 NOTE — Telephone Encounter (Signed)
-----   Message from Raymond J de Guam, MD sent at 10/18/2020  1:09 PM EDT ----- Hemoglobin A1c which provides an estimate of average blood sugar over the past 3 months was in prediabetes range at 5.7% which is similar to prior reading in 2019.  Electrolytes and kidney function and liver function are all within normal limits.  Cholesterol panel shows elevated total cholesterol and "bad" cholesterol with normal "good" cholesterol.  Slight elevation in triglycerides as well.  Mild anemia present with slight increase in platelet counts above normal range as well.  Will order iron studies for further evaluation.

## 2020-10-18 NOTE — Addendum Note (Signed)
Addended by: Campbell Riches on: 10/18/2020 01:32 PM   Modules accepted: Orders

## 2020-10-18 NOTE — Telephone Encounter (Signed)
Results released by Dr. de [place] and reviewed by patient via [place] patient to contact the office with any questions or concerns. Contacted the lab for add on iron studies panel

## 2020-11-04 ENCOUNTER — Encounter (HOSPITAL_BASED_OUTPATIENT_CLINIC_OR_DEPARTMENT_OTHER): Payer: Self-pay | Admitting: Family Medicine

## 2020-11-04 DIAGNOSIS — Z809 Family history of malignant neoplasm, unspecified: Secondary | ICD-10-CM

## 2020-11-07 ENCOUNTER — Encounter (HOSPITAL_BASED_OUTPATIENT_CLINIC_OR_DEPARTMENT_OTHER): Payer: Self-pay | Admitting: Family Medicine

## 2020-11-08 ENCOUNTER — Telehealth (HOSPITAL_BASED_OUTPATIENT_CLINIC_OR_DEPARTMENT_OTHER): Payer: Self-pay

## 2020-11-08 ENCOUNTER — Telehealth: Payer: Self-pay | Admitting: Genetic Counselor

## 2020-11-08 NOTE — Telephone Encounter (Signed)
Results released by Dr. de [place] and reviewed by patient via MyChart Instructed patient to contact the office with any questions or concerns.  

## 2020-11-08 NOTE — Telephone Encounter (Signed)
-----   Message from Raymond J de [place], MD sent at 11/08/2020 11:03 AM EDT ----- Labs indicate that most likely cause of mild anemia is relatively low iron.  Can likely address initially with ensuring adequate dietary intake of iron -foods high in iron include nuts, beans, peas, meat, spinach.  Can also discuss oral iron supplementation at next visit.

## 2020-11-08 NOTE — Telephone Encounter (Signed)
Received a genetic counseling referral from Dr. De [place] for fhx of cancer. Ms. Aden has been cld and scheduled to see Cari on 5/26 at 1pm. Pt aware to arrive 15 minutes early.

## 2020-11-22 ENCOUNTER — Inpatient Hospital Stay: Payer: 59 | Attending: Genetic Counselor | Admitting: Genetic Counselor

## 2020-11-22 ENCOUNTER — Other Ambulatory Visit: Payer: Self-pay

## 2020-11-22 ENCOUNTER — Inpatient Hospital Stay: Payer: 59

## 2020-11-22 DIAGNOSIS — Z800 Family history of malignant neoplasm of digestive organs: Secondary | ICD-10-CM | POA: Diagnosis not present

## 2020-11-22 DIAGNOSIS — Z85858 Personal history of malignant neoplasm of other endocrine glands: Secondary | ICD-10-CM

## 2020-11-22 DIAGNOSIS — Z803 Family history of malignant neoplasm of breast: Secondary | ICD-10-CM | POA: Diagnosis not present

## 2020-11-22 DIAGNOSIS — Z8049 Family history of malignant neoplasm of other genital organs: Secondary | ICD-10-CM | POA: Diagnosis not present

## 2020-11-22 DIAGNOSIS — Z8042 Family history of malignant neoplasm of prostate: Secondary | ICD-10-CM

## 2020-11-23 ENCOUNTER — Encounter: Payer: Self-pay | Admitting: Genetic Counselor

## 2020-11-23 DIAGNOSIS — Z8042 Family history of malignant neoplasm of prostate: Secondary | ICD-10-CM

## 2020-11-23 DIAGNOSIS — Z8049 Family history of malignant neoplasm of other genital organs: Secondary | ICD-10-CM

## 2020-11-23 DIAGNOSIS — Z803 Family history of malignant neoplasm of breast: Secondary | ICD-10-CM

## 2020-11-23 DIAGNOSIS — Z800 Family history of malignant neoplasm of digestive organs: Secondary | ICD-10-CM

## 2020-11-23 HISTORY — DX: Family history of malignant neoplasm of breast: Z80.3

## 2020-11-23 HISTORY — DX: Family history of malignant neoplasm of other genital organs: Z80.49

## 2020-11-23 HISTORY — DX: Family history of malignant neoplasm of prostate: Z80.42

## 2020-11-23 HISTORY — DX: Family history of malignant neoplasm of digestive organs: Z80.0

## 2020-11-23 NOTE — Progress Notes (Signed)
REFERRING PROVIDER: de [place], Raymond J, [place] [place],  [place] [address]  PRIMARY PROVIDER:  de [place], Blondell Reveal, MD  PRIMARY REASON FOR VISIT:  1. Family history of pancreatic cancer   2. Family history of breast cancer   3. Family history of uterine cancer   4. Family history of prostate cancer     HISTORY OF PRESENT ILLNESS:   [name] [name], a 45 y.o. female, was seen for a [place] cancer genetics consultation at the request of Dr. Tennis Must [place] due to a family history of cancer.  Ms. Canedo presents to clinic today with her husband, Eustace Moore, to discuss the possibility of a hereditary predisposition to cancer, to discuss genetic testing, and to further clarify her future cancer risks, as well as potential cancer risks for family members.    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 The treatment plan included surgery.    CANCER HISTORY:  Oncology History   No history exists.    RISK FACTORS:  Menarche was at age 36.  First live birth at age 54.  OCP use for approximately 10 years.  Ovaries intact: yes.  Hysterectomy: no.  Menopausal status: premenopausal.  HRT use: 0 years. Colonoscopy: no; not examined. Mammogram within the last year: yes. Number of breast biopsies: 0. Up to date with pelvic exams: yes. Any excessive radiation exposure in the past: no  Past Medical History:  Diagnosis Date  . Bulging lumbar disc   . 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 Complication of anesthesia   . Depression   . Family history of breast cancer 11/23/2020  . Family history of pancreatic cancer 11/23/2020  . Family history of prostate cancer 11/23/2020  . Family history of uterine cancer 11/23/2020  . History of MRSA infection "years ago"   axilla  . Hypertension    under control with med., has been on med. x 5 yr.  Marland Kitchen PONV (postoperative nausea and vomiting)     Past Surgical History:  Procedure Laterality Date  . CESAREAN SECTION     . LIPOMA EXCISION Left 05/31/2018   Procedure: EXCISION OF LEFT HIP LIPOMA;  Surgeon: Greer Pickerel, MD;  Location: [facility];  Service: General;  Laterality: Left;  Marland Kitchen MICRODISCECTOMY LUMBAR  06/2012   L5  . PAROTIDECTOMY Left 05/09/2013   Procedure: LEFT SUPERFICIAL PAROTIDECTOMY;  Surgeon: Jodi Marble, MD;  Location: [facility];  Service: ENT;  Laterality: Left;  . PAROTIDECTOMY Left 05/16/2013   Procedure: COMPLETION PAROTIDECTOMY LEFT WITH FACIAL  NERVE PRESERVATION ;  Surgeon: Jodi Marble, MD;  Location: [facility];  Service: ENT;  Laterality: Left;  . TUBAL LIGATION    . URETHRAL SLING  2016    Social History   Socioeconomic History  . Marital status: Married    Spouse name: Not on file  . Number of children: Not on file  . Years of education: Not on file  . Highest education level: Not on file  Occupational History  . Not on file  Tobacco Use  . Smoking status: Former Research scientist (life sciences)  . Smokeless tobacco: Never Used  . Tobacco comment: quit smoking 2007  Vaping Use  . Vaping Use: Never used  Substance and Sexual Activity  . Alcohol use: Yes    Comment: rarely  . Drug use: No  . Sexual activity: Yes    Birth control/protection: None, Surgical  Comment: BTL  Other Topics Concern  . Not on file  Social History Narrative  . Not on file   Social Determinants of Health   Financial Resource Strain: Not on file  Food Insecurity: Not on file  Transportation Needs: Not on file  Physical Activity: Not on file  Stress: Not on file  Social Connections: Not on file     FAMILY HISTORY:  We obtained a detailed, 4-generation family history.  Significant diagnoses are listed below: Family History  Problem Relation Age of Onset  . Breast cancer Mother        dx early 80s  . Uterine cancer Mother        dx mid-late 55s  . Diabetes Mother   . Pancreatic cancer Mother 53  . Prostate cancer Paternal Grandfather        dx late  42s  . Breast cancer Other        MGM's sister    Ms. Hagarty has two sons, ages 47 and 74.  She has no siblings.  Ms. Jarrard mother recently passed away after being diagnosed with pancreatic cancer at age 34.  She did not have genetic testing.  Ms. Goggins mother also had a history of breast cancer in her 72s and uterine cancer in her 44s.  Ms. Berland maternal grandmother's sister may have had a breast cancer history.  Ms. Monrroy father passed away this year at age 35.  Ms. Serafin paternal grandfather had a history of prostate cancer diagnosed in his 22s.   Ms. Luhrs is unaware of previous family history of genetic testing for hereditary cancer risks. There is no reported Ashkenazi Jewish ancestry. There is no known consanguinity.  GENETIC COUNSELING ASSESSMENT: Ms. Topor is a 45 y.o. female with a family history of cancer which is somewhat suggestive of a hereditary cancer syndrome and predisposition to cancer given the presence of multiple and related cancers in her mother and maternal family members. We, therefore, discussed and recommended the following at today's visit.   DISCUSSION: We discussed that 5 - 10% of cancer is hereditary, with most cases of hereditary pancreatic cancer cancer associated with mutations in BRCA1/2.  There are other genes that can be associated with hereditary pancreatic cancer syndromes.  These include but are not limited to CDKN2A and ATM.  We discussed that testing is beneficial for several reasons, including knowing about other cancer risks, identifying potential screening and risk-reduction options that may be appropriate, and to understanding if other family members could be at risk for cancer and allowing them to undergo genetic testing.  We reviewed the characteristics, features and inheritance patterns of hereditary cancer syndromes. We also discussed genetic testing, including the appropriate family members to test, the process of testing, insurance coverage and  turn-around-time for results. We discussed the implications of a negative, positive, carrier and/or variant of uncertain significant result. We discussed that negative results may be uninformative given that Ms. Nading does not have a personal history of breast or pancreatic cancer. We recommended Ms. Manchester pursue genetic testing for a panel that contains genes associated with breast and pancreatic cancer.  The CustomNext-Cancer +RNAinsight Panel offered by [facility] and includes sequencing and rearrangement analysis for the following 91 genes: AIP, ALK, APC, ATM, AXIN2, BAP1, BARD1, BLM, BMPR1A, BRCA1, BRCA2, BRIP1, CDC73, CDH1, CDK4, CDKN1B, CDKN2A, CHEK2, CTNNA1, DICER1, FANCC, FH, FLCN, GALNT12, KIF1B, LZTR1, MAX, MEN1, MET, MLH1, MRE11A, MSH2, MSH3, MSH6, MUTYH, NBN, NF1, NF2, NTHL1, PALB2, PHOX2B, PMS2, POT1, PRKAR1A,  PTCH1, PTEN, RAD50, RAD51C, RAD51D, RB1, RECQL, RET, SDHA, SDHAF2, SDHB, SDHC, SDHD, SMAD4, SMARCA4, SMARCB1, SMARCE1, STK11, SUFU, TMEM127, TP53, TSC1, TSC2, VHL and XRCC2 (sequencing and deletion/duplication); CASR, CFTR, CPA1, CTRC, EGFR, EGLN1, FAM175A, HOXB13, KIT, MITF, MLH3, PALLD, PDGFRA, POLD1, POLE, PRSS1, RINT1, RPS20, SPINK1 and TERT (sequencing only); EPCAM and GREM1 (deletion/duplication only). RNA data is routinely analyzed for use in variant interpretation for all genes.  Based on Ms. Hosking's family history of pancreatic cancer in a first degree relatives, she meets medical criteria for genetic testing. Despite that she meets criteria, she may still have an out of pocket cost. We discussed that if her out of pocket cost for testing is over $100, the laboratory should contact her to discuss self-pay options and/or patient pay assistance programs.   We discussed that some people do not want to undergo genetic testing due to fear of genetic discrimination.  A federal law called the Genetic Information Non-Discrimination Act (GINA) of 2008 helps protect individuals against  genetic discrimination based on their genetic test results.  It impacts both health insurance and employment.  With health insurance, it protects against increased premiums, being kicked off insurance or being forced to take a test in order to be insured.  For employment it protects against hiring, firing and promoting decisions based on genetic test results.  GINA does not apply to those in the [employer], those who work for companies with less than 15 employees, and new life insurance or long-term disability insurance policies.  Health status due to a cancer diagnosis is not protected under GINA.  PLAN: After considering the risks, benefits, and limitations, Ms. Squyres provided informed consent to pursue genetic testing and the blood sample was sent to [facility] for analysis of the CustomNext-Cancer +RNAinsight Panel. Results should be available within approximately 3 weeks' time, at which point they will be disclosed by telephone to Ms. Coonan, as will any additional recommendations warranted by these results. Ms. Strider will receive a summary of her genetic counseling visit and a copy of her results once available. This information will also be available in Epic.   Lastly, we encouraged Ms. Ace to remain in contact with cancer genetics annually so that we can continuously update the family history and inform her of any changes in cancer genetics and testing that may be of benefit for this family.   Ms. Somma questions were answered to her satisfaction today. Our contact information was provided should additional questions or concerns arise. Thank you for the referral and allowing [place] to share in the care of your patient.   Enes Wegener M. Joette Catching, [place], [facility] Genetic Counselor [email or web address] (P) [phone]   The patient was seen for a total of 30 minutes in face-to-face genetic counseling.  Drs. Magrinat, Lindi Adie and/or Burr Medico were available to discuss this case as needed.   _______________________________________________________________________ For Office Staff:  Number of people involved in session: 1 Was an Intern/ student involved with case: yes

## 2020-12-07 ENCOUNTER — Telehealth: Payer: Self-pay | Admitting: Genetic Counselor

## 2020-12-07 ENCOUNTER — Ambulatory Visit: Payer: Self-pay | Admitting: Genetic Counselor

## 2020-12-07 ENCOUNTER — Encounter: Payer: Self-pay | Admitting: Genetic Counselor

## 2020-12-07 DIAGNOSIS — Z800 Family history of malignant neoplasm of digestive organs: Secondary | ICD-10-CM

## 2020-12-07 DIAGNOSIS — Z803 Family history of malignant neoplasm of breast: Secondary | ICD-10-CM

## 2020-12-07 DIAGNOSIS — Z8042 Family history of malignant neoplasm of prostate: Secondary | ICD-10-CM

## 2020-12-07 DIAGNOSIS — Z1379 Encounter for other screening for genetic and chromosomal anomalies: Secondary | ICD-10-CM | POA: Insufficient documentation

## 2020-12-07 DIAGNOSIS — Z8049 Family history of malignant neoplasm of other genital organs: Secondary | ICD-10-CM

## 2020-12-07 NOTE — Telephone Encounter (Signed)
Revealed negative genetic testing.  Discussed that we do not know why there is cancer in the family. It could sporadic/familial, due to a mutation she did not inherit, be due to a different gene that we are not testing, or maybe our current technology may not be able to pick something up.  It will be important for her to keep in contact with genetics to keep up with whether additional testing may be needed.

## 2020-12-07 NOTE — Telephone Encounter (Signed)
Contacted patient in attempt to disclose results of genetic testing.  LVM with contact information requesting a call back.  

## 2020-12-07 NOTE — Progress Notes (Signed)
HPI:  Ms. Ziolkowski was previously seen in the [place] clinic due to a family history of cancer and concerns regarding a hereditary predisposition to cancer. Please refer to our prior cancer genetics clinic note for more information regarding our discussion, assessment and recommendations, at the time. Ms. Oyola recent genetic test results were disclosed to her, as were recommendations warranted by these results. These results and recommendations are discussed in more detail below.  CANCER HISTORY:  Oncology History   No history exists.   At the age of 45, Ms. Quintanilla was diagnosed with cancer of the parotid gland. The treatment plan included surgery.  FAMILY HISTORY:  We obtained a detailed, 4-generation family history.  Significant diagnoses are listed below: Family History  Problem Relation Age of Onset   Breast cancer Mother        dx early 34s   Uterine cancer Mother        dx mid-late 63s   Diabetes Mother    Pancreatic cancer Mother 25   Prostate cancer Paternal Grandfather        dx late 43s   Breast cancer Other        MGM's sister    Ms. Supak has two sons, ages 46 and 82.  She has no siblings.  Ms. Borawski mother recently passed away after being diagnosed with pancreatic cancer at age 18.  She did not have genetic testing.  Ms. Belvin mother also had a history of breast cancer in her 11s and uterine cancer in her 9s.  Ms. Skluzacek maternal grandmother's sister may have had a breast cancer history.  Ms. Jordan father passed away this year at age 80.  Ms. Pember paternal grandfather had a history of prostate cancer diagnosed in his 43s.   Ms. Yokoyama is unaware of previous family history of genetic testing for hereditary cancer risks. There is no reported Ashkenazi Jewish ancestry. There is no known consanguinity.  GENETIC TEST RESULTS: Genetic testing reported out on December 06, 2020.  The CustomNext-Cancer +RNAinsight Panel through [facility] found no pathogenic mutations. The  CustomNext-Cancer +RNAinsight Panel offered by [facility] and includes sequencing and rearrangement analysis for the following 91 genes: AIP, ALK, APC, ATM, AXIN2, BAP1, BARD1, BLM, BMPR1A, BRCA1, BRCA2, BRIP1, CDC73, CDH1, CDK4, CDKN1B, CDKN2A, CHEK2, CTNNA1, DICER1, FANCC, FH, FLCN, GALNT12, KIF1B, LZTR1, MAX, MEN1, MET, MLH1, MRE11A, MSH2, MSH3, MSH6, MUTYH, NBN, NF1, NF2, NTHL1, PALB2, PHOX2B, PMS2, POT1, PRKAR1A, PTCH1, PTEN, RAD50, RAD51C, RAD51D, RB1, RECQL, RET, SDHA, SDHAF2, SDHB, SDHC, SDHD, SMAD4, SMARCA4, SMARCB1, SMARCE1, STK11, SUFU, TMEM127, TP53, TSC1, TSC2, VHL and XRCC2 (sequencing and deletion/duplication); CASR, CFTR, CPA1, CTRC, EGFR, EGLN1, FAM175A, HOXB13, KIT, MITF, MLH3, PALLD, PDGFRA, POLD1, POLE, PRSS1, RINT1, RPS20, SPINK1 and TERT (sequencing only); EPCAM and GREM1 (deletion/duplication only). RNA data is routinely analyzed for use in variant interpretation for all genes.  The test report has been scanned into EPIC and is located under the Molecular Pathology section of the Results Review tab.  A portion of the result report is included below for reference.     We discussed with Ms. Engebretson that because current genetic testing is not perfect, it is possible there may be a gene mutation in one of these genes that current testing cannot detect, but that chance is small.  We also discussed, that there could be another gene that has not yet been discovered, or that we have not yet tested, that is responsible for the cancer diagnoses in the family. It  is also possible there is a hereditary cause for the cancer in the family that Ms. Hissong did not inherit and therefore was not identified in her testing.  Therefore, it is important to remain in touch with cancer genetics in the future so that we can continue to offer Ms. Dubberly the most up to date genetic testing.   ADDITIONAL GENETIC TESTING: We discussed with Ms. Filion that her genetic testing was fairly extensive.  If there are genes  identified to increase cancer risk that can be analyzed in the future, we would be happy to discuss and coordinate this testing at that time.    CANCER SCREENING RECOMMENDATIONS: Ms. Mcelrath test result is considered negative (normal).  This means that we have not identified a hereditary cause for her family history of cancer at this time.   While reassuring, this does not definitively rule out a hereditary predisposition to cancer. It is still possible that there could be genetic mutations that are undetectable by current technology. There could be genetic mutations in genes that have not been tested or identified to increase cancer risk.  Therefore, it is recommended she continue to follow the cancer management and screening guidelines provided by her primary healthcare provider.   An individual's cancer risk and medical management are not determined by genetic test results alone. Overall cancer risk assessment incorporates additional factors, including personal medical history, family history, and any available genetic information that may result in a personalized plan for cancer prevention and surveillance  RECOMMENDATIONS FOR FAMILY MEMBERS:  Individuals in this family might be at some increased risk of developing cancer, over the general population risk, simply due to the family history of cancer.  We recommended women in this family have a yearly mammogram beginning at age 23, or 70 years younger than the earliest onset of cancer, an annual clinical breast exam, and perform monthly breast self-exams. Women in this family should also have a gynecological exam as recommended by their primary provider. Family members should be referred for colonoscopy starting at age 69.  It is also possible there is a hereditary cause for the cancer in Ms. Ky's family that she did not inherit and therefore was not identified in her.  Based on Ms. Nesheiwat's family history of pancreatic cancer in her mother, we  recommended her maternal uncle have genetic counseling and testing. Ms. Trimarco will let us know if we can be of any assistance in coordinating genetic counseling and/or testing for this family member.   FOLLOW-UP: Lastly, we discussed with Ms. Mandt that cancer genetics is a rapidly advancing field and it is possible that new genetic tests will be appropriate for her and/or her family members in the future. We encouraged her to remain in contact with cancer genetics on an annual basis so we can update her personal and family histories and let her know of advances in cancer genetics that may benefit this family.   Our contact number was provided. Ms. Tramontana questions were answered to her satisfaction, and she knows she is welcome to call us at anytime with additional questions or concerns.     Vollie Brunty M. Joette Catching, [place], [facility] Genetic Counselor [email or web address] (P) [phone]

## 2020-12-18 ENCOUNTER — Other Ambulatory Visit (HOSPITAL_BASED_OUTPATIENT_CLINIC_OR_DEPARTMENT_OTHER): Payer: Self-pay

## 2021-01-15 ENCOUNTER — Ambulatory Visit (HOSPITAL_BASED_OUTPATIENT_CLINIC_OR_DEPARTMENT_OTHER): Payer: 59 | Admitting: Family Medicine

## 2021-01-25 ENCOUNTER — Other Ambulatory Visit (HOSPITAL_BASED_OUTPATIENT_CLINIC_OR_DEPARTMENT_OTHER): Payer: Self-pay

## 2021-01-25 DIAGNOSIS — Z01419 Encounter for gynecological examination (general) (routine) without abnormal findings: Secondary | ICD-10-CM | POA: Diagnosis not present

## 2021-01-25 DIAGNOSIS — Z85818 Personal history of malignant neoplasm of other sites of lip, oral cavity, and pharynx: Secondary | ICD-10-CM | POA: Diagnosis not present

## 2021-01-25 MED ORDER — CITALOPRAM HYDROBROMIDE 20 MG PO TABS
20.0000 mg | ORAL_TABLET | Freq: Every day | ORAL | 3 refills | Status: DC
  Filled 2021-01-25 – 2021-03-19 (×3): qty 90, 90d supply, fill #0
  Filled 2021-08-27: qty 90, 90d supply, fill #1

## 2021-01-31 ENCOUNTER — Other Ambulatory Visit (HOSPITAL_BASED_OUTPATIENT_CLINIC_OR_DEPARTMENT_OTHER): Payer: Self-pay

## 2021-01-31 ENCOUNTER — Other Ambulatory Visit (HOSPITAL_BASED_OUTPATIENT_CLINIC_OR_DEPARTMENT_OTHER): Payer: Self-pay | Admitting: Family Medicine

## 2021-01-31 DIAGNOSIS — I10 Essential (primary) hypertension: Secondary | ICD-10-CM

## 2021-01-31 MED ORDER — LISINOPRIL 20 MG PO TABS
20.0000 mg | ORAL_TABLET | Freq: Every day | ORAL | 0 refills | Status: DC
  Filled 2021-01-31: qty 90, 90d supply, fill #0

## 2021-01-31 NOTE — Telephone Encounter (Signed)
Patient scheduled to follow up on 08/09 Will authorize refill

## 2021-02-05 ENCOUNTER — Ambulatory Visit (INDEPENDENT_AMBULATORY_CARE_PROVIDER_SITE_OTHER): Payer: 59 | Admitting: Family Medicine

## 2021-02-05 ENCOUNTER — Other Ambulatory Visit: Payer: Self-pay

## 2021-02-05 VITALS — BP 128/78 | HR 77 | Ht 66.0 in | Wt 277.6 lb

## 2021-02-05 DIAGNOSIS — E785 Hyperlipidemia, unspecified: Secondary | ICD-10-CM | POA: Diagnosis not present

## 2021-02-05 DIAGNOSIS — I10 Essential (primary) hypertension: Secondary | ICD-10-CM

## 2021-02-05 NOTE — Progress Notes (Signed)
    Procedures performed today:    None.  Independent interpretation of notes and tests performed by another provider:   None.  Brief History, Exam, Impression, and Recommendations:    BP 128/78   Pulse 77   Ht '5\' 6"'$  (1.676 m)   Wt 277 lb 9.6 oz (125.9 kg)   SpO2 99%   BMI 44.81 kg/m   Essential hypertension Reports that she is doing well Continue to take lisinopril as prescribed Has been checking blood pressure intermittently at work, reports that readings are 120s over 70s Denies any issues with chest pain, headaches, lightheadedness or dizziness Does not need refills today Blood pressure at goal in office today, cardiovascular exam with regular rate and rhythm, no murmurs appreciated Continue with medication at current dose Can continue with intermittent blood pressure checks at home/work  Dyslipidemia Recent labs with slightly elevated total cholesterol, LDL Discussed specific lifestyle modifications, patient has started exercise regimen at the gym downstairs, encouraged to continue with this Current ASCVD risk score 1.3% Continue to monitor lipid panel in the future  Plan for follow-up in about 6 months   ___________________________________________ Kimiyah Blick de [place], MD, ABFM, CAQSM Primary [facility] and [place]

## 2021-02-05 NOTE — Assessment & Plan Note (Signed)
Reports that she is doing well Continue to take lisinopril as prescribed Has been checking blood pressure intermittently at work, reports that readings are 120s over 70s Denies any issues with chest pain, headaches, lightheadedness or dizziness Does not need refills today Blood pressure at goal in office today, cardiovascular exam with regular rate and rhythm, no murmurs appreciated Continue with medication at current dose Can continue with intermittent blood pressure checks at home/work

## 2021-02-05 NOTE — Assessment & Plan Note (Signed)
Recent labs with slightly elevated total cholesterol, LDL Discussed specific lifestyle modifications, patient has started exercise regimen at the gym downstairs, encouraged to continue with this Current ASCVD risk score 1.3% Continue to monitor lipid panel in the future

## 2021-02-05 NOTE — Patient Instructions (Signed)
  Medication Instructions:  Your physician recommends that you continue on your current medications as directed. Please refer to the Current Medication list given to you today. --If you need a refill on any your medications before your next appointment, please call your pharmacy first. If no refills are authorized on file call the office.-- Follow-Up: Your next appointment:   Your physician recommends that you schedule a follow-up appointment in: 6 MONTHS with Dr. de [place]  Thanks for letting us be apart of your health journey!!  Primary Care and Sports Medicine   Dr. Arlina Robes [place]   We encourage you to activate your patient portal called "MyChart".  Sign up information is provided on this After Visit Summary.  MyChart is used to connect with patients for Virtual Visits (Telemedicine).  Patients are able to view lab/test results, encounter notes, upcoming appointments, etc.  Non-urgent messages can be sent to your provider as well. To learn more about what you can do with MyChart, please visit --  [email or web address].

## 2021-02-06 ENCOUNTER — Ambulatory Visit (HOSPITAL_BASED_OUTPATIENT_CLINIC_OR_DEPARTMENT_OTHER): Payer: 59 | Admitting: Family Medicine

## 2021-02-27 ENCOUNTER — Other Ambulatory Visit (HOSPITAL_BASED_OUTPATIENT_CLINIC_OR_DEPARTMENT_OTHER): Payer: Self-pay

## 2021-02-27 MED ORDER — CARESTART COVID-19 HOME TEST VI KIT
PACK | 0 refills | Status: DC
  Filled 2021-02-27: qty 2, 4d supply, fill #0

## 2021-03-19 ENCOUNTER — Other Ambulatory Visit (HOSPITAL_BASED_OUTPATIENT_CLINIC_OR_DEPARTMENT_OTHER): Payer: Self-pay

## 2021-03-20 ENCOUNTER — Other Ambulatory Visit (HOSPITAL_BASED_OUTPATIENT_CLINIC_OR_DEPARTMENT_OTHER): Payer: Self-pay

## 2021-03-25 ENCOUNTER — Other Ambulatory Visit (HOSPITAL_BASED_OUTPATIENT_CLINIC_OR_DEPARTMENT_OTHER): Payer: Self-pay

## 2021-03-25 MED ORDER — INFLUENZA VAC SPLIT QUAD 0.5 ML IM SUSY
PREFILLED_SYRINGE | INTRAMUSCULAR | 0 refills | Status: DC
  Filled 2021-03-25: qty 0.5, 1d supply, fill #0

## 2021-05-03 ENCOUNTER — Other Ambulatory Visit (HOSPITAL_BASED_OUTPATIENT_CLINIC_OR_DEPARTMENT_OTHER): Payer: Self-pay

## 2021-05-06 DIAGNOSIS — H524 Presbyopia: Secondary | ICD-10-CM | POA: Diagnosis not present

## 2021-05-07 ENCOUNTER — Other Ambulatory Visit (HOSPITAL_BASED_OUTPATIENT_CLINIC_OR_DEPARTMENT_OTHER): Payer: Self-pay

## 2021-05-07 MED ORDER — OZEMPIC (0.25 OR 0.5 MG/DOSE) 2 MG/1.5ML ~~LOC~~ SOPN
0.2500 mg | PEN_INJECTOR | SUBCUTANEOUS | 3 refills | Status: DC
  Filled 2021-05-07: qty 1.5, 28d supply, fill #0
  Filled 2021-06-03: qty 1.5, 28d supply, fill #1
  Filled 2021-06-26: qty 1.5, 28d supply, fill #2
  Filled 2021-07-29: qty 1.5, 28d supply, fill #3

## 2021-05-08 ENCOUNTER — Other Ambulatory Visit (HOSPITAL_BASED_OUTPATIENT_CLINIC_OR_DEPARTMENT_OTHER): Payer: Self-pay

## 2021-05-08 MED ORDER — ONDANSETRON HCL 8 MG PO TABS
ORAL_TABLET | ORAL | 2 refills | Status: DC
  Filled 2021-05-08: qty 30, 10d supply, fill #0
  Filled 2021-08-27: qty 30, 10d supply, fill #1

## 2021-05-08 MED ORDER — OZEMPIC (0.25 OR 0.5 MG/DOSE) 2 MG/1.5ML ~~LOC~~ SOPN: PEN_INJECTOR | SUBCUTANEOUS | 5 refills | Status: DC

## 2021-05-21 ENCOUNTER — Other Ambulatory Visit (HOSPITAL_BASED_OUTPATIENT_CLINIC_OR_DEPARTMENT_OTHER): Payer: Self-pay | Admitting: Family Medicine

## 2021-05-21 ENCOUNTER — Other Ambulatory Visit (HOSPITAL_BASED_OUTPATIENT_CLINIC_OR_DEPARTMENT_OTHER): Payer: Self-pay

## 2021-05-21 DIAGNOSIS — I10 Essential (primary) hypertension: Secondary | ICD-10-CM

## 2021-05-21 MED ORDER — LISINOPRIL 20 MG PO TABS
20.0000 mg | ORAL_TABLET | Freq: Every day | ORAL | 0 refills | Status: DC
Start: 2021-05-21 — End: 2021-08-26
  Filled 2021-05-21: qty 90, 90d supply, fill #0

## 2021-05-27 ENCOUNTER — Other Ambulatory Visit (HOSPITAL_BASED_OUTPATIENT_CLINIC_OR_DEPARTMENT_OTHER): Payer: Self-pay | Admitting: Family Medicine

## 2021-05-27 DIAGNOSIS — Z1231 Encounter for screening mammogram for malignant neoplasm of breast: Secondary | ICD-10-CM

## 2021-06-03 ENCOUNTER — Other Ambulatory Visit (HOSPITAL_BASED_OUTPATIENT_CLINIC_OR_DEPARTMENT_OTHER): Payer: Self-pay

## 2021-06-05 ENCOUNTER — Other Ambulatory Visit (HOSPITAL_BASED_OUTPATIENT_CLINIC_OR_DEPARTMENT_OTHER): Payer: Self-pay

## 2021-06-06 ENCOUNTER — Ambulatory Visit (HOSPITAL_BASED_OUTPATIENT_CLINIC_OR_DEPARTMENT_OTHER): Payer: 59 | Admitting: Radiology

## 2021-06-18 ENCOUNTER — Other Ambulatory Visit: Payer: Self-pay

## 2021-06-18 ENCOUNTER — Ambulatory Visit (HOSPITAL_BASED_OUTPATIENT_CLINIC_OR_DEPARTMENT_OTHER)
Admission: RE | Admit: 2021-06-18 | Discharge: 2021-06-18 | Disposition: A | Discharge status: Home / Self Care | Payer: 59 | Source: Ambulatory Visit | Attending: Family Medicine | Admitting: Family Medicine

## 2021-06-18 DIAGNOSIS — Z1231 Encounter for screening mammogram for malignant neoplasm of breast: Secondary | ICD-10-CM | POA: Diagnosis not present

## 2021-06-26 ENCOUNTER — Other Ambulatory Visit (HOSPITAL_BASED_OUTPATIENT_CLINIC_OR_DEPARTMENT_OTHER): Payer: Self-pay

## 2021-07-02 DIAGNOSIS — L578 Other skin changes due to chronic exposure to nonionizing radiation: Secondary | ICD-10-CM | POA: Diagnosis not present

## 2021-07-02 DIAGNOSIS — L813 Cafe au lait spots: Secondary | ICD-10-CM | POA: Diagnosis not present

## 2021-07-02 DIAGNOSIS — D239 Other benign neoplasm of skin, unspecified: Secondary | ICD-10-CM | POA: Diagnosis not present

## 2021-07-02 DIAGNOSIS — Z23 Encounter for immunization: Secondary | ICD-10-CM | POA: Diagnosis not present

## 2021-07-02 DIAGNOSIS — L814 Other melanin hyperpigmentation: Secondary | ICD-10-CM | POA: Diagnosis not present

## 2021-07-02 DIAGNOSIS — D229 Melanocytic nevi, unspecified: Secondary | ICD-10-CM | POA: Diagnosis not present

## 2021-07-02 DIAGNOSIS — Z86018 Personal history of other benign neoplasm: Secondary | ICD-10-CM | POA: Diagnosis not present

## 2021-07-02 DIAGNOSIS — D1801 Hemangioma of skin and subcutaneous tissue: Secondary | ICD-10-CM | POA: Diagnosis not present

## 2021-07-02 DIAGNOSIS — L821 Other seborrheic keratosis: Secondary | ICD-10-CM | POA: Diagnosis not present

## 2021-07-04 ENCOUNTER — Other Ambulatory Visit (HOSPITAL_BASED_OUTPATIENT_CLINIC_OR_DEPARTMENT_OTHER): Payer: Self-pay

## 2021-07-04 MED ORDER — METHYLPREDNISOLONE 4 MG PO TBPK
ORAL_TABLET | ORAL | 0 refills | Status: DC
  Filled 2021-07-04: qty 21, 6d supply, fill #0

## 2021-07-08 DIAGNOSIS — C07 Malignant neoplasm of parotid gland: Secondary | ICD-10-CM | POA: Diagnosis not present

## 2021-07-09 ENCOUNTER — Other Ambulatory Visit (HOSPITAL_BASED_OUTPATIENT_CLINIC_OR_DEPARTMENT_OTHER): Payer: Self-pay

## 2021-07-09 MED ORDER — FLUCONAZOLE 150 MG PO TABS
ORAL_TABLET | ORAL | 1 refills | Status: DC
  Filled 2021-07-09: qty 2, 3d supply, fill #0

## 2021-07-09 MED ORDER — BUPROPION HCL ER (XL) 150 MG PO TB24
150.0000 mg | ORAL_TABLET | Freq: Every day | ORAL | 3 refills | Status: DC
  Filled 2021-07-09: qty 90, 90d supply, fill #0
  Filled 2021-10-14: qty 90, 90d supply, fill #1

## 2021-07-12 ENCOUNTER — Ambulatory Visit (HOSPITAL_BASED_OUTPATIENT_CLINIC_OR_DEPARTMENT_OTHER): Payer: 59 | Admitting: Orthopaedic Surgery

## 2021-07-15 ENCOUNTER — Other Ambulatory Visit (HOSPITAL_BASED_OUTPATIENT_CLINIC_OR_DEPARTMENT_OTHER): Payer: Self-pay

## 2021-07-15 DIAGNOSIS — N390 Urinary tract infection, site not specified: Secondary | ICD-10-CM | POA: Diagnosis not present

## 2021-07-25 ENCOUNTER — Other Ambulatory Visit (HOSPITAL_BASED_OUTPATIENT_CLINIC_OR_DEPARTMENT_OTHER): Payer: Self-pay

## 2021-07-29 ENCOUNTER — Other Ambulatory Visit (HOSPITAL_BASED_OUTPATIENT_CLINIC_OR_DEPARTMENT_OTHER): Payer: Self-pay

## 2021-07-29 ENCOUNTER — Other Ambulatory Visit (HOSPITAL_BASED_OUTPATIENT_CLINIC_OR_DEPARTMENT_OTHER): Payer: Self-pay | Admitting: Family Medicine

## 2021-07-29 MED ORDER — OZEMPIC (1 MG/DOSE) 4 MG/3ML ~~LOC~~ SOPN
PEN_INJECTOR | SUBCUTANEOUS | 1 refills | Status: DC
  Filled 2021-07-29: qty 9, 84d supply, fill #0
  Filled 2021-09-23 – 2021-10-21 (×2): qty 9, 84d supply, fill #1

## 2021-07-30 ENCOUNTER — Other Ambulatory Visit (HOSPITAL_BASED_OUTPATIENT_CLINIC_OR_DEPARTMENT_OTHER): Payer: Self-pay

## 2021-08-01 ENCOUNTER — Other Ambulatory Visit (HOSPITAL_BASED_OUTPATIENT_CLINIC_OR_DEPARTMENT_OTHER): Payer: Self-pay

## 2021-08-26 ENCOUNTER — Other Ambulatory Visit (HOSPITAL_BASED_OUTPATIENT_CLINIC_OR_DEPARTMENT_OTHER): Payer: Self-pay

## 2021-08-26 ENCOUNTER — Other Ambulatory Visit (HOSPITAL_BASED_OUTPATIENT_CLINIC_OR_DEPARTMENT_OTHER): Payer: Self-pay | Admitting: Family Medicine

## 2021-08-26 DIAGNOSIS — I10 Essential (primary) hypertension: Secondary | ICD-10-CM

## 2021-08-27 ENCOUNTER — Other Ambulatory Visit (HOSPITAL_BASED_OUTPATIENT_CLINIC_OR_DEPARTMENT_OTHER): Payer: Self-pay | Admitting: Family Medicine

## 2021-08-27 ENCOUNTER — Other Ambulatory Visit (HOSPITAL_BASED_OUTPATIENT_CLINIC_OR_DEPARTMENT_OTHER): Payer: Self-pay

## 2021-08-27 DIAGNOSIS — I10 Essential (primary) hypertension: Secondary | ICD-10-CM

## 2021-08-27 MED ORDER — LISINOPRIL 20 MG PO TABS
20.0000 mg | ORAL_TABLET | Freq: Every day | ORAL | 0 refills | Status: DC
  Filled 2021-08-27: qty 30, 30d supply, fill #0

## 2021-09-23 ENCOUNTER — Other Ambulatory Visit (HOSPITAL_BASED_OUTPATIENT_CLINIC_OR_DEPARTMENT_OTHER): Payer: Self-pay

## 2021-09-23 ENCOUNTER — Other Ambulatory Visit (HOSPITAL_BASED_OUTPATIENT_CLINIC_OR_DEPARTMENT_OTHER): Payer: Self-pay | Admitting: Family Medicine

## 2021-09-23 DIAGNOSIS — I10 Essential (primary) hypertension: Secondary | ICD-10-CM

## 2021-09-23 MED ORDER — LISINOPRIL 20 MG PO TABS
20.0000 mg | ORAL_TABLET | Freq: Every day | ORAL | 0 refills | Status: DC
  Filled 2021-09-23: qty 30, 30d supply, fill #0

## 2021-09-24 ENCOUNTER — Other Ambulatory Visit (HOSPITAL_BASED_OUTPATIENT_CLINIC_OR_DEPARTMENT_OTHER): Payer: Self-pay

## 2021-09-25 ENCOUNTER — Other Ambulatory Visit (HOSPITAL_BASED_OUTPATIENT_CLINIC_OR_DEPARTMENT_OTHER): Payer: Self-pay

## 2021-09-25 MED ORDER — CARESTART COVID-19 HOME TEST VI KIT
PACK | 0 refills | Status: DC
  Filled 2021-09-25: qty 4, 8d supply, fill #0

## 2021-10-14 ENCOUNTER — Other Ambulatory Visit (HOSPITAL_BASED_OUTPATIENT_CLINIC_OR_DEPARTMENT_OTHER): Payer: Self-pay

## 2021-10-15 ENCOUNTER — Other Ambulatory Visit (HOSPITAL_BASED_OUTPATIENT_CLINIC_OR_DEPARTMENT_OTHER): Payer: Self-pay

## 2021-10-21 ENCOUNTER — Other Ambulatory Visit (HOSPITAL_BASED_OUTPATIENT_CLINIC_OR_DEPARTMENT_OTHER): Payer: Self-pay

## 2021-10-21 ENCOUNTER — Other Ambulatory Visit (HOSPITAL_BASED_OUTPATIENT_CLINIC_OR_DEPARTMENT_OTHER): Payer: Self-pay | Admitting: Family Medicine

## 2021-10-21 DIAGNOSIS — I10 Essential (primary) hypertension: Secondary | ICD-10-CM

## 2021-10-22 ENCOUNTER — Other Ambulatory Visit (HOSPITAL_BASED_OUTPATIENT_CLINIC_OR_DEPARTMENT_OTHER): Payer: Self-pay

## 2021-10-22 MED ORDER — LISINOPRIL 20 MG PO TABS
20.0000 mg | ORAL_TABLET | Freq: Every day | ORAL | 0 refills | Status: DC
  Filled 2021-10-22: qty 30, 30d supply, fill #0

## 2021-10-28 ENCOUNTER — Other Ambulatory Visit (HOSPITAL_BASED_OUTPATIENT_CLINIC_OR_DEPARTMENT_OTHER): Payer: Self-pay

## 2021-11-26 ENCOUNTER — Other Ambulatory Visit (HOSPITAL_BASED_OUTPATIENT_CLINIC_OR_DEPARTMENT_OTHER): Payer: Self-pay

## 2021-11-26 ENCOUNTER — Other Ambulatory Visit (HOSPITAL_BASED_OUTPATIENT_CLINIC_OR_DEPARTMENT_OTHER): Payer: Self-pay | Admitting: Family Medicine

## 2021-11-26 DIAGNOSIS — I10 Essential (primary) hypertension: Secondary | ICD-10-CM

## 2021-11-26 MED ORDER — LISINOPRIL 20 MG PO TABS
20.0000 mg | ORAL_TABLET | Freq: Every day | ORAL | 0 refills | Status: DC
  Filled 2021-11-26: qty 30, 30d supply, fill #0

## 2021-12-16 ENCOUNTER — Other Ambulatory Visit (HOSPITAL_BASED_OUTPATIENT_CLINIC_OR_DEPARTMENT_OTHER): Payer: Self-pay

## 2021-12-24 ENCOUNTER — Encounter (HOSPITAL_BASED_OUTPATIENT_CLINIC_OR_DEPARTMENT_OTHER): Payer: Self-pay | Admitting: Nurse Practitioner

## 2021-12-24 ENCOUNTER — Ambulatory Visit (INDEPENDENT_AMBULATORY_CARE_PROVIDER_SITE_OTHER): Payer: 59 | Admitting: Nurse Practitioner

## 2021-12-24 ENCOUNTER — Other Ambulatory Visit (HOSPITAL_BASED_OUTPATIENT_CLINIC_OR_DEPARTMENT_OTHER): Payer: Self-pay

## 2021-12-24 VITALS — BP 112/72 | HR 86 | Ht 66.0 in | Wt 274.0 lb

## 2021-12-24 DIAGNOSIS — Z6841 Body Mass Index (BMI) 40.0 and over, adult: Secondary | ICD-10-CM | POA: Diagnosis not present

## 2021-12-24 DIAGNOSIS — Z0000 Encounter for general adult medical examination without abnormal findings: Secondary | ICD-10-CM | POA: Diagnosis not present

## 2021-12-24 DIAGNOSIS — I10 Essential (primary) hypertension: Secondary | ICD-10-CM

## 2021-12-24 DIAGNOSIS — E559 Vitamin D deficiency, unspecified: Secondary | ICD-10-CM

## 2021-12-24 DIAGNOSIS — F321 Major depressive disorder, single episode, moderate: Secondary | ICD-10-CM

## 2021-12-24 MED ORDER — LISINOPRIL 20 MG PO TABS
20.0000 mg | ORAL_TABLET | Freq: Every day | ORAL | 3 refills | Status: DC
  Filled 2021-12-24: qty 90, 90d supply, fill #0
  Filled 2022-03-27: qty 90, 90d supply, fill #1
  Filled 2022-08-07: qty 90, 90d supply, fill #2
  Filled 2022-11-18: qty 90, 90d supply, fill #3

## 2021-12-24 MED ORDER — BUPROPION HCL ER (XL) 150 MG PO TB24
150.0000 mg | ORAL_TABLET | Freq: Every day | ORAL | 3 refills | Status: DC
  Filled 2021-12-24 – 2021-12-25 (×2): qty 90, 90d supply, fill #0
  Filled 2022-03-27: qty 90, 90d supply, fill #1
  Filled 2022-08-23 (×2): qty 90, 90d supply, fill #2
  Filled 2022-11-18: qty 90, 90d supply, fill #3

## 2021-12-24 NOTE — Progress Notes (Signed)
BP 112/72   Pulse 86   Ht '5\' 6"'$  (1.676 m)   Wt 274 lb (124.3 kg)   SpO2 96%   BMI 44.22 kg/m    Subjective:    Patient ID: [name] [name], female    DOB: [birth year], 46 y.o.   MRN: [id number]  HPI: [name] [name] is a 46 y.o. female presenting on 12/24/2021 for comprehensive medical examination.   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.  She enjoys her work and reports she is generally happy.  She has a history of cancer of the left parotid gland, but tells me this is overall stable with no concerns at this time.    She denies ETOH use  She denies nictotine use  She denies illegal substance use   She reports regular menses with no concerns for menopausal symptoms. She has annual cervical cancer screening done due to family history.  She is currently sexually active and she denies concerns today about STI  She denies concerns about skin changes today  She denies concerns about bowel changes today  She denies concerns about bladder changes today   Most Recent Depression Screen:     10/15/2020   12:46 PM  Depression screen PHQ 2/9  Decreased Interest 0  Down, Depressed, Hopeless 0  PHQ - 2 Score 0   Most Recent Anxiety Screen:      No data to display         Most Recent Fall Screen:    12/25/2021    7:49 PM  [place] in the past year? 0  Number falls in past yr: 0  Injury with Fall? 0  Risk for fall due to : No Fall Risks  Follow up Falls evaluation completed    All ROS negative except what is listed above and in the HPI.   Past medical history, surgical history, medications, allergies, family history and social history reviewed with patient today and changes made to appropriate areas of the chart.  Past Medical History:  Past Medical History:  Diagnosis Date   Bulging lumbar disc    Cancer of parotid gland ([place]) [id number]   left   Complication of anesthesia     Depression    Family history of breast cancer 11/23/2020   Family history of pancreatic cancer 11/23/2020   Family history of prostate cancer 11/23/2020   Family history of uterine cancer 11/23/2020   History of MRSA infection "years ago"   axilla   Hypertension    under control with med., has been on med. x 5 yr.   PONV (postoperative nausea and vomiting)    Medications:  Current Outpatient Medications on File Prior to Visit  Medication Sig   cholecalciferol (VITAMIN D) 1000 UNITS tablet Take 1,000 Units by mouth daily.   ibuprofen (ADVIL,MOTRIN) 200 MG tablet Take 600 mg by mouth every 8 (eight) hours as needed (for pain.).   ondansetron (ZOFRAN) 8 MG tablet Take 1 tablet by mouth, as needed for nausea, up to three times a day   No current facility-administered medications on file prior to visit.   Surgical History:  Past Surgical History:  Procedure Laterality Date   CESAREAN SECTION     LIPOMA EXCISION Left 05/31/2018   Procedure: EXCISION OF LEFT HIP LIPOMA;  Surgeon: Greer Pickerel, MD;  Location: [facility];  Service: General;  Laterality: Left;   MICRODISCECTOMY LUMBAR  06/2012   L5   PAROTIDECTOMY Left 05/09/2013   Procedure: LEFT SUPERFICIAL PAROTIDECTOMY;  Surgeon: Jodi Marble, MD;  Location: [facility];  Service: ENT;  Laterality: Left;   PAROTIDECTOMY Left 05/16/2013   Procedure: COMPLETION PAROTIDECTOMY LEFT WITH FACIAL  NERVE PRESERVATION ;  Surgeon: Jodi Marble, MD;  Location: [facility];  Service: ENT;  Laterality: Left;   TUBAL LIGATION     URETHRAL SLING  2016   Allergies:  Allergies  Allergen Reactions   Keflex [Cephalexin] Rash   Sulfa Antibiotics Rash   Social History:  Social History   Socioeconomic History   Marital status: Married    Spouse name: Not on file   Number of children: Not on file   Years of education: Not on file   Highest education level: Not on file  Occupational History   Not on file   Tobacco Use   Smoking status: Former   Smokeless tobacco: Never   Tobacco comments:    quit smoking 2007  Vaping Use   Vaping Use: Never used  Substance and Sexual Activity   Alcohol use: Yes    Comment: rarely   Drug use: No   Sexual activity: Yes    Birth control/protection: None, Surgical    Comment: BTL  Other Topics Concern   Not on file  Social History Narrative   Not on file   Social Determinants of Health   Financial Resource Strain: Not on file  Food Insecurity: Not on file  Transportation Needs: Not on file  Physical Activity: Not on file  Stress: Not on file  Social Connections: Not on file  Intimate Partner Violence: Not on file   Social History   Tobacco Use  Smoking Status Former  Smokeless Tobacco Never  Tobacco Comments   quit smoking 2007   Social History   Substance and Sexual Activity  Alcohol Use Yes   Comment: rarely   Family History:  Family History  Problem Relation Age of Onset   Breast cancer Mother        dx [place] Larkin 50s   Uterine cancer Mother        dx mid-late 54s   Diabetes Mother    Pancreatic cancer Mother 63   Prostate cancer Paternal Grandfather        dx late 32s   Breast cancer Other        MGM's sister       Objective:    BP 112/72   Pulse 86   Ht '5\' 6"'$  (1.676 m)   Wt 274 lb (124.3 kg)   SpO2 96%   BMI 44.22 kg/m   Wt Readings from Last 3 Encounters:  12/24/21 274 lb (124.3 kg)  02/05/21 277 lb 9.6 oz (125.9 kg)  10/15/20 277 lb 6.4 oz (125.8 kg)    Physical Exam Vitals and nursing note reviewed.  Constitutional:      General: She is not in acute distress.    Appearance: Normal appearance.  HENT:     Head: Normocephalic and atraumatic.     Right Ear: Hearing, tympanic membrane, ear canal and external ear normal.     Left Ear: Hearing, tympanic membrane, ear canal and external ear normal.     Nose: Nose normal.     Right Sinus: No maxillary sinus tenderness or frontal sinus tenderness.     Left  Sinus: No maxillary sinus tenderness or frontal sinus tenderness.     Mouth/Throat:  Lips: Pink.     Mouth: Mucous membranes are moist.     Pharynx: Oropharynx is clear.  Eyes:     General: Lids are normal. Vision grossly intact.     Extraocular Movements: Extraocular movements intact.     Conjunctiva/sclera: Conjunctivae normal.     Pupils: Pupils are equal, round, and reactive to light.     Funduscopic exam:    Right eye: Red reflex present.        Left eye: Red reflex present.    Visual Fields: Right eye visual fields normal and left eye visual fields normal.  Neck:     Thyroid: No thyromegaly.     Vascular: No carotid bruit.  Cardiovascular:     Rate and Rhythm: Normal rate and regular rhythm.     Chest Wall: PMI is not displaced.     Pulses: Normal pulses.          Dorsalis pedis pulses are 2+ on the right side and 2+ on the left side.       Posterior tibial pulses are 2+ on the right side and 2+ on the left side.     Heart sounds: Normal heart sounds. No murmur heard. Pulmonary:     Effort: Pulmonary effort is normal. No respiratory distress.     Breath sounds: Normal breath sounds.  Abdominal:     General: Abdomen is flat. Bowel sounds are normal. There is no distension.     Palpations: Abdomen is soft. There is no hepatomegaly, splenomegaly or mass.     Tenderness: There is no abdominal tenderness. There is no right CVA tenderness, left CVA tenderness, guarding or rebound.  Musculoskeletal:        General: Normal range of motion.     Cervical back: Full passive range of motion without pain, normal range of motion and neck supple. No tenderness.     Right lower leg: No edema.     Left lower leg: No edema.  Feet:     Left foot:     Toenail Condition: Left toenails are normal.  Lymphadenopathy:     Cervical: No cervical adenopathy.     Upper Body:     Right upper body: No supraclavicular adenopathy.     Left upper body: No supraclavicular adenopathy.  Skin:     General: Skin is warm and dry.     Capillary Refill: Capillary refill takes less than 2 seconds.     Nails: There is no clubbing.  Neurological:     General: No focal deficit present.     Mental Status: She is alert and oriented to person, place, and time.     GCS: GCS eye subscore is 4. GCS verbal subscore is 5. GCS motor subscore is 6.     Sensory: Sensation is intact.     Motor: Motor function is intact.     Coordination: Coordination is intact.     Gait: Gait is intact.     Deep Tendon Reflexes: Reflexes are normal and symmetric.  Psychiatric:        Attention and Perception: Attention normal.        Mood and Affect: Mood normal.        Speech: Speech normal.        Behavior: Behavior normal. Behavior is cooperative.        Thought Content: Thought content normal.        Cognition and Memory: Cognition and memory normal.  Judgment: Judgment normal.     Results for orders placed or performed in visit on 10/18/20  Ferritin  Result Value Ref Range   Ferritin 14 11 - 307 ng/mL  Iron and TIBC  Result Value Ref Range   Iron 37 28 - 170 ug/dL   TIBC 434 250 - 450 ug/dL   Saturation Ratios 9 (L) 10.4 - 31.8 %   UIBC 397 ug/dL      Assessment & Plan:   Problem List Items Addressed This Visit     Essential hypertension   Relevant Medications   lisinopril (ZESTRIL) 20 MG tablet   Other Relevant Orders   CBC with Differential/Platelet   Comprehensive metabolic panel   Lipid panel   POCT glycosylated hemoglobin (Hb A1C)   TSH   T4, free   VITAMIN D 25 Hydroxy (Vit-D Deficiency, Fractures)   Body mass index (BMI) 40.0-44.9, adult (HCC)   Relevant Medications   buPROPion (WELLBUTRIN XL) 150 MG 24 hr tablet   Other Relevant Orders   CBC with Differential/Platelet   Comprehensive metabolic panel   Lipid panel   POCT glycosylated hemoglobin (Hb A1C)   TSH   T4, free   VITAMIN D 25 Hydroxy (Vit-D Deficiency, Fractures)   Other Visit Diagnoses     Encounter for  annual physical exam    -  Primary   Depression, major, single episode, moderate (HCC)       Relevant Medications   buPROPion (WELLBUTRIN XL) 150 MG 24 hr tablet   Other Relevant Orders   CBC with Differential/Platelet   Comprehensive metabolic panel   Lipid panel   POCT glycosylated hemoglobin (Hb A1C)   TSH   T4, free   VITAMIN D 25 Hydroxy (Vit-D Deficiency, Fractures)   Health maintenance examination       Relevant Orders   CBC with Differential/Platelet   Comprehensive metabolic panel   Lipid panel   POCT glycosylated hemoglobin (Hb A1C)   TSH   T4, free   VITAMIN D 25 Hydroxy (Vit-D Deficiency, Fractures)         IMMUNIZATIONS:   - Tdap: Tetanus vaccination status reviewed: last tetanus booster within 10 years. - Influenza: Postponed to flu season - Pneumovax: Not applicable - Prevnar: Not applicable - HPV: Not applicable - Zostavax vaccine: Not applicable  SCREENING: - Pap smear: done elsewhere - STI testing: deferred -Mammogram: Up to date  - Colonoscopy: Up to date  - Bone Density: Not applicable  -Hearing Test: Not applicable  -Spirometry: Not applicable   Follow up plan: Return in about 1 year (around 12/25/2022) for CPE.  NEXT PREVENTATIVE PHYSICAL DUE IN 1 YEAR.  PATIENT COUNSELING PROVIDED:   For all adult patients, I recommend   A well balanced diet low in saturated fats, cholesterol, and moderation in carbohydrates.   This can be as simple as monitoring portion sizes and cutting back on sugary beverages such as soda and juice to start with.    Daily water consumption of at least 64 ounces.  Physical activity at least 180 minutes per week, if just starting out.   This can be as simple as taking the stairs instead of the elevator and walking 2-3 laps around the office  purposefully every day.   STD protection, partner selection, and regular testing if high risk.  Limited consumption of alcoholic beverages if alcohol is consumed.  For women, I  recommend no more than 7 alcoholic beverages per week, spread out throughout the week.  Avoid "  binge" drinking or consuming large quantities of alcohol in one setting.   Please let me know if you feel you may need help with reduction or quitting alcohol consumption.   Avoidance of nicotine, if used.  Please let me know if you feel you may need help with reduction or quitting nicotine use.   Daily mental health attention.  This can be in the form of 5 minute daily meditation, prayer, journaling, yoga, reflection, etc.   Purposeful attention to your emotions and mental state can significantly improve your overall wellbeing and Health.  Please know that I am here to help you with all of your health care goals and am happy to work with you to find a solution that works best for you.  The greatest advice I have received with any changes in life are to take it one step at a time, that even means if all you can focus on is the next 60 seconds, then do that and celebrate your victories.  With any changes in life, you will have set backs, and that is OK. The important thing to remember is, if you have a set back, it is not a failure, it is an opportunity to try again!  Health Maintenance Recommendations Screening Testing Mammogram Every 1 -2 years based on history and risk factors Starting at age 19 Pap Smear Ages 21-39 every 3 years Ages 76-65 every 5 years with HPV testing More frequent testing may be required based on results and history Colon Cancer Screening Every 1-10 years based on test performed, risk factors, and history Starting at age 56 Bone Density Screening Every 2-10 years based on history Starting at age 44 for women Recommendations for men differ based on medication usage, history, and risk factors AAA Screening One time ultrasound Men 55-25 years old who have every smoked Lung Cancer Screening Low Dose Lung CT every 12 months Age 81-80 years with a 30 pack-year smoking  history who still smoke or who have quit within the last 15 years  Screening Labs Routine  Labs: Complete Blood Count (CBC), Complete Metabolic Panel (CMP), Cholesterol (Lipid Panel) Every 6-12 months based on history and medications May be recommended more frequently based on current conditions or previous results Hemoglobin A1c Lab Every 3-12 months based on history and previous results Starting at age 81 or earlier with diagnosis of diabetes, high cholesterol, BMI >26, and/or risk factors Frequent monitoring for patients with diabetes to ensure blood sugar control Thyroid Panel (TSH w/ T3 & T4) Every 6 months based on history, symptoms, and risk factors May be repeated more often if on medication HIV One time testing for all patients 70 and older May be repeated more frequently for patients with increased risk factors or exposure Hepatitis C One time testing for all patients 58 and older May be repeated more frequently for patients with increased risk factors or exposure Gonorrhea, Chlamydia Every 12 months for all sexually active persons 13-24 years Additional monitoring may be recommended for those who are considered high risk or who have symptoms PSA Men 51-20 years old with risk factors Additional screening may be recommended from age 70-69 based on risk factors, symptoms, and history  Vaccine Recommendations Tetanus Booster All adults every 10 years Flu Vaccine All patients 6 months and older every year COVID Vaccine All patients 12 years and older Initial dosing with booster May recommend additional booster based on age and health history HPV Vaccine 2 doses all patients age 43-26 Dosing may be  considered for patients over 26 Shingles Vaccine (Shingrix) 2 doses all adults 42 years and older Pneumonia (Pneumovax 23) All adults 57 years and older May recommend earlier dosing based on health history Pneumonia (Prevnar 13) All adults 6 years and older Dosed 1 year  after Pneumovax 23  Additional Screening, Testing, and Vaccinations may be recommended on an individualized basis based on family history, health history, risk factors, and/or exposure.

## 2021-12-25 ENCOUNTER — Other Ambulatory Visit (HOSPITAL_BASED_OUTPATIENT_CLINIC_OR_DEPARTMENT_OTHER): Payer: Self-pay

## 2021-12-27 ENCOUNTER — Ambulatory Visit (HOSPITAL_BASED_OUTPATIENT_CLINIC_OR_DEPARTMENT_OTHER): Payer: 59

## 2021-12-27 DIAGNOSIS — F321 Major depressive disorder, single episode, moderate: Secondary | ICD-10-CM | POA: Diagnosis not present

## 2021-12-27 DIAGNOSIS — Z6841 Body Mass Index (BMI) 40.0 and over, adult: Secondary | ICD-10-CM | POA: Diagnosis not present

## 2021-12-27 DIAGNOSIS — I10 Essential (primary) hypertension: Secondary | ICD-10-CM | POA: Diagnosis not present

## 2021-12-27 DIAGNOSIS — Z0000 Encounter for general adult medical examination without abnormal findings: Secondary | ICD-10-CM | POA: Diagnosis not present

## 2021-12-28 LAB — COMPREHENSIVE METABOLIC PANEL
ALT: 13 IU/L (ref 0–32)
AST: 16 IU/L (ref 0–40)
Albumin/Globulin Ratio: 1.6 (ref 1.2–2.2)
Albumin: 4.1 g/dL (ref 3.8–4.8)
Alkaline Phosphatase: 51 IU/L (ref 44–121)
BUN/Creatinine Ratio: 22 (ref 9–23)
BUN: 14 mg/dL (ref 6–24)
Bilirubin Total: 0.3 mg/dL (ref 0.0–1.2)
CO2: 22 mmol/L (ref 20–29)
Calcium: 9.2 mg/dL (ref 8.7–10.2)
Chloride: 102 mmol/L (ref 96–106)
Creatinine, Ser: 0.63 mg/dL (ref 0.57–1.00)
Globulin, Total: 2.6 g/dL (ref 1.5–4.5)
Glucose: 93 mg/dL (ref 70–99)
Potassium: 4.5 mmol/L (ref 3.5–5.2)
Sodium: 139 mmol/L (ref 134–144)
Total Protein: 6.7 g/dL (ref 6.0–8.5)
eGFR: 113 mL/min/{1.73_m2} (ref >59)

## 2021-12-28 LAB — LIPID PANEL
Chol/HDL Ratio: 4.1 ratio (ref 0.0–4.4)
Cholesterol, Total: 187 mg/dL (ref 100–199)
HDL: 46 mg/dL (ref >39)
LDL Chol Calc (NIH): 115 mg/dL — ABNORMAL HIGH (ref 0–99)
Triglycerides: 144 mg/dL (ref 0–149)
VLDL Cholesterol Cal: 26 mg/dL (ref 5–40)

## 2021-12-28 LAB — CBC WITH DIFFERENTIAL/PLATELET
Basophils Absolute: 0.1 10*3/uL (ref 0.0–0.2)
Basos: 1 %
EOS (ABSOLUTE): 0.2 10*3/uL (ref 0.0–0.4)
Eos: 2 %
Hematocrit: 36.4 % (ref 34.0–46.6)
Hemoglobin: 11.5 g/dL (ref 11.1–15.9)
Immature Grans (Abs): 0 10*3/uL (ref 0.0–0.1)
Immature Granulocytes: 0 %
Lymphocytes Absolute: 2.6 10*3/uL (ref 0.7–3.1)
Lymphs: 31 %
MCH: 26.6 pg (ref 26.6–33.0)
MCHC: 31.6 g/dL (ref 31.5–35.7)
MCV: 84 fL (ref 79–97)
Monocytes Absolute: 0.5 10*3/uL (ref 0.1–0.9)
Monocytes: 7 %
Neutrophils Absolute: 4.8 10*3/uL (ref 1.4–7.0)
Neutrophils: 59 %
Platelets: 406 10*3/uL (ref 150–450)
RBC: 4.32 x10E6/uL (ref 3.77–5.28)
RDW: 13.8 % (ref 11.7–15.4)
WBC: 8.2 10*3/uL (ref 3.4–10.8)

## 2021-12-28 LAB — T4, FREE: Free T4: 1.34 ng/dL (ref 0.82–1.77)

## 2021-12-28 LAB — VITAMIN D 25 HYDROXY (VIT D DEFICIENCY, FRACTURES): Vit D, 25-Hydroxy: 23.2 ng/mL — ABNORMAL LOW (ref 30.0–100.0)

## 2021-12-28 LAB — TSH: TSH: 2.47 u[IU]/mL (ref 0.450–4.500)

## 2022-01-01 ENCOUNTER — Other Ambulatory Visit (HOSPITAL_BASED_OUTPATIENT_CLINIC_OR_DEPARTMENT_OTHER): Payer: Self-pay

## 2022-01-01 MED ORDER — VITAMIN D (ERGOCALCIFEROL) 1.25 MG (50000 UNIT) PO CAPS
50000.0000 [IU] | ORAL_CAPSULE | ORAL | 0 refills | Status: DC
  Filled 2022-01-01: qty 8, 56d supply, fill #0

## 2022-01-01 NOTE — Addendum Note (Signed)
Addended by: Giannie Soliday, Clarise Cruz E on: 01/01/2022 08:03 AM   Modules accepted: Orders

## 2022-02-10 ENCOUNTER — Other Ambulatory Visit (HOSPITAL_BASED_OUTPATIENT_CLINIC_OR_DEPARTMENT_OTHER): Payer: Self-pay

## 2022-03-13 ENCOUNTER — Other Ambulatory Visit (HOSPITAL_BASED_OUTPATIENT_CLINIC_OR_DEPARTMENT_OTHER): Payer: Self-pay

## 2022-03-13 MED ORDER — INFLUENZA VAC SPLIT QUAD 0.5 ML IM SUSY
PREFILLED_SYRINGE | INTRAMUSCULAR | 0 refills | Status: DC
  Filled 2022-03-13: qty 0.5, 1d supply, fill #0

## 2022-03-27 ENCOUNTER — Other Ambulatory Visit (HOSPITAL_BASED_OUTPATIENT_CLINIC_OR_DEPARTMENT_OTHER): Payer: Self-pay

## 2022-04-07 DIAGNOSIS — H9312 Tinnitus, left ear: Secondary | ICD-10-CM | POA: Diagnosis not present

## 2022-04-11 ENCOUNTER — Inpatient Hospital Stay: Admission: RE | Admit: 2022-04-11 | Payer: 59 | Source: Ambulatory Visit

## 2022-04-18 DIAGNOSIS — H9312 Tinnitus, left ear: Secondary | ICD-10-CM | POA: Diagnosis not present

## 2022-04-18 DIAGNOSIS — C07 Malignant neoplasm of parotid gland: Secondary | ICD-10-CM | POA: Diagnosis not present

## 2022-04-18 IMAGING — MG DIGITAL SCREENING BILAT W/ TOMO W/ CAD
8 series · 8 of 24 positions shown · non-contrast
Comparison: Previous exam(s).

CLINICAL DATA: Screening.

EXAM:
DIGITAL SCREENING BILATERAL MAMMOGRAM WITH TOMO AND CAD

[L CC synth-2D]
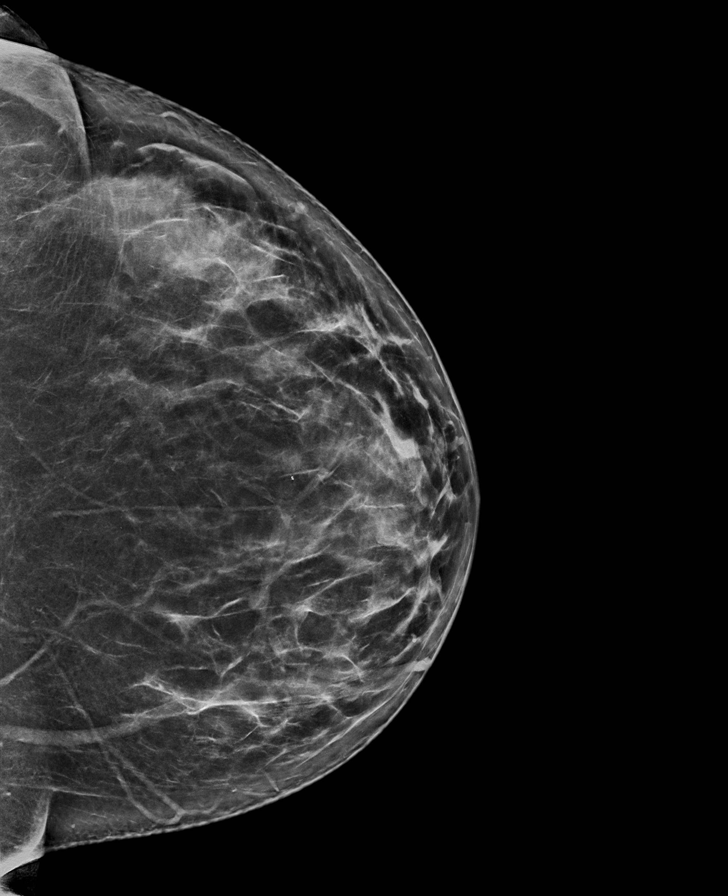

[R MLO synth-2D]
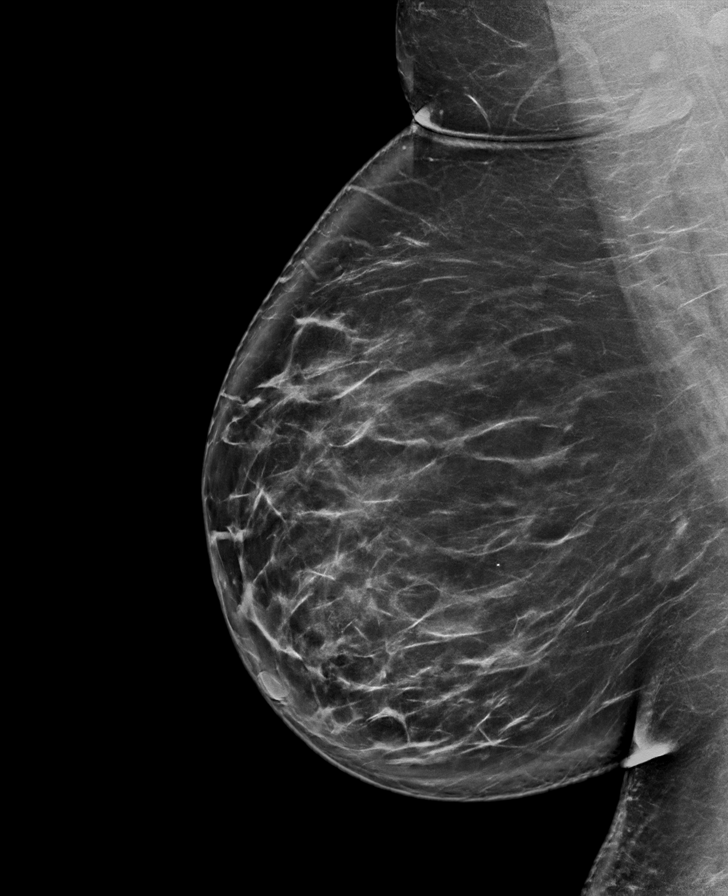

[R CC synth-2D]
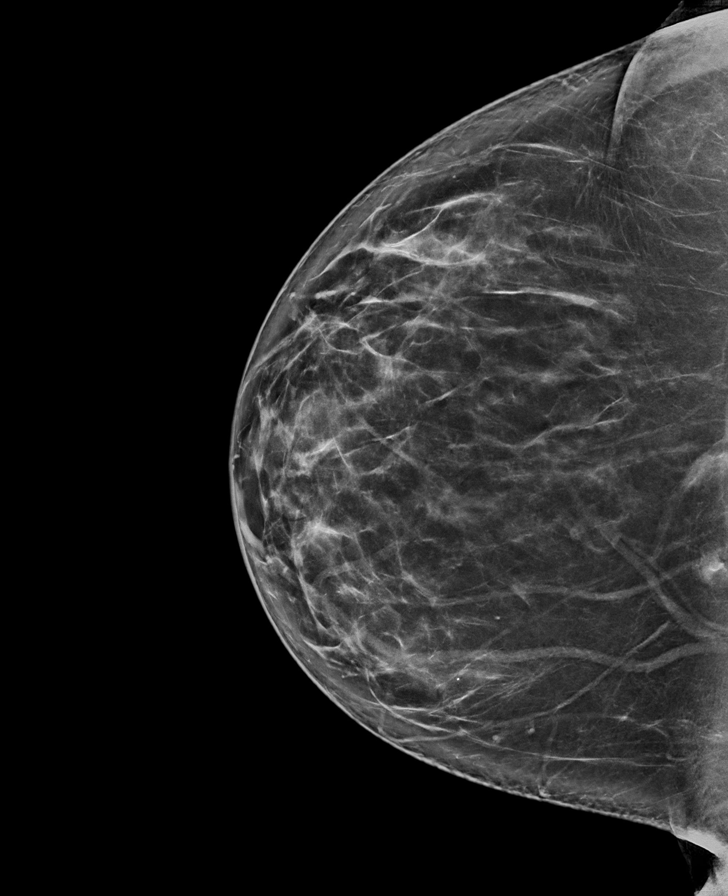

[L MLO synth-2D]
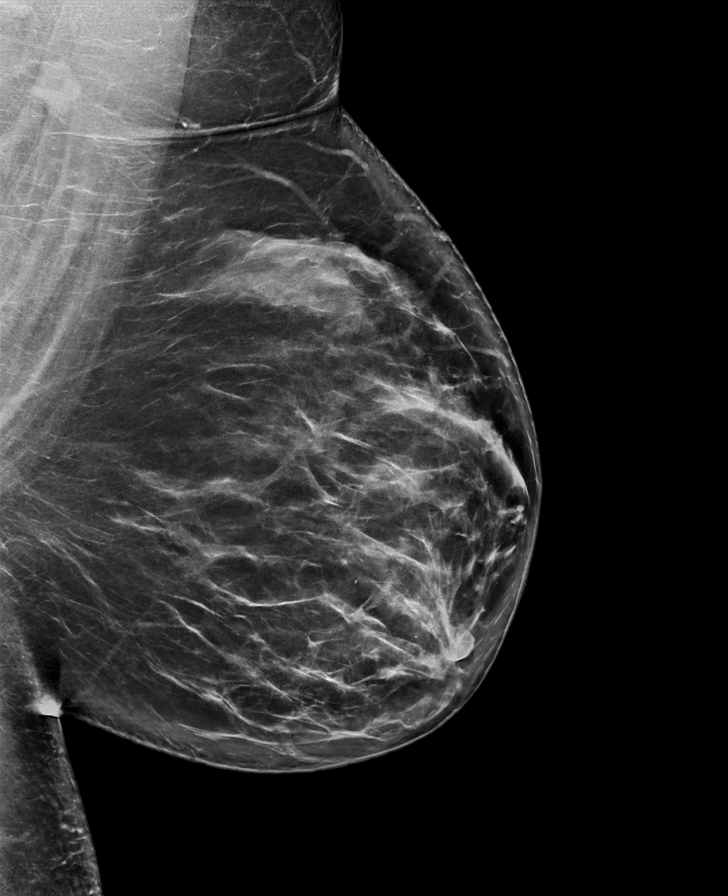

[L CC tomo · tomo slice 46/91.0]
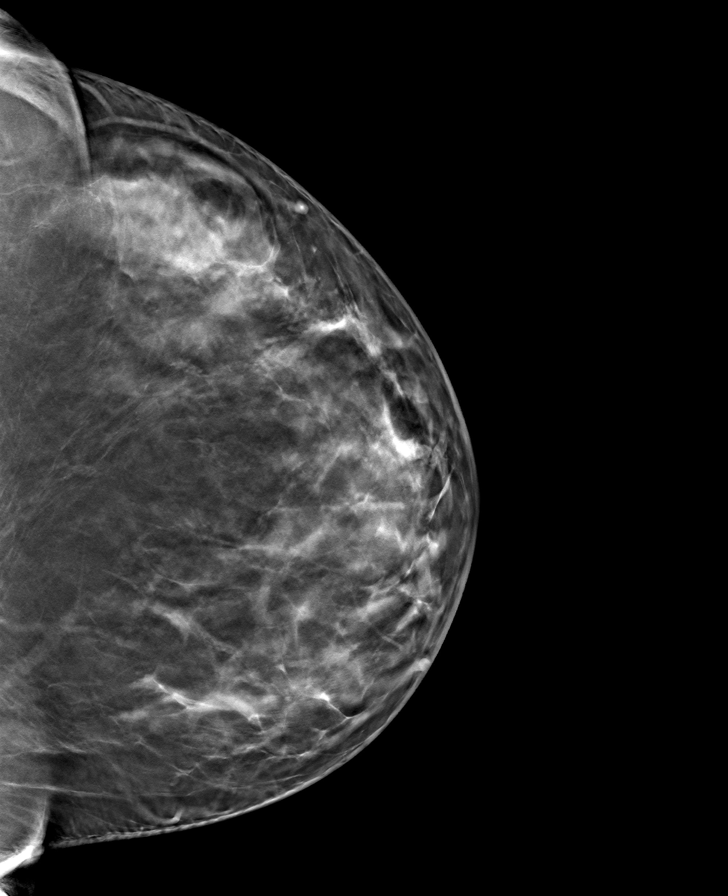

[R MLO tomo · tomo slice 55/109.0]
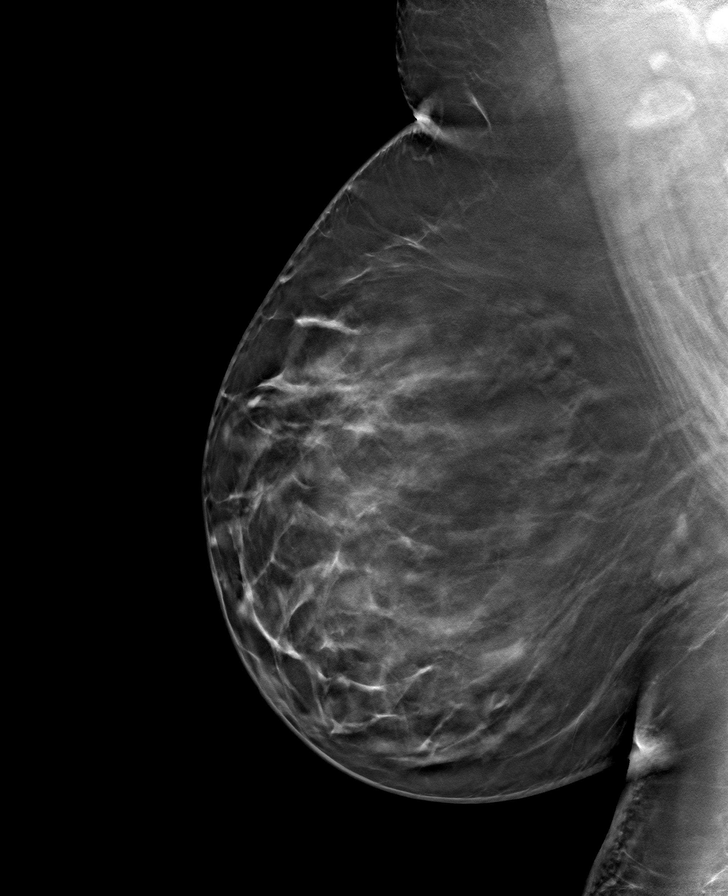

[R CC tomo · tomo slice 49/97.0]
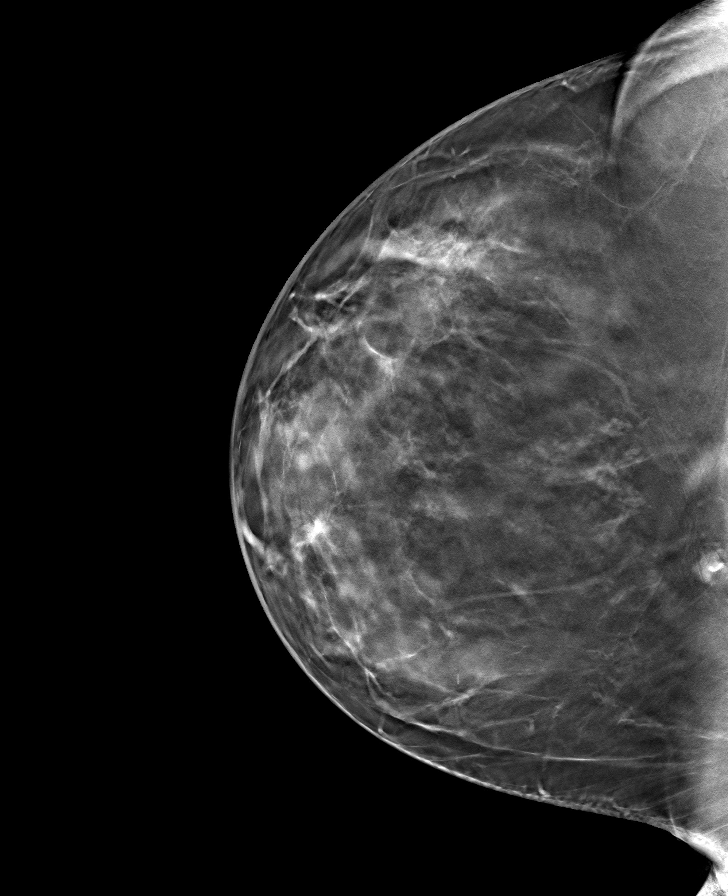

[L MLO tomo · tomo slice 53/104.0]
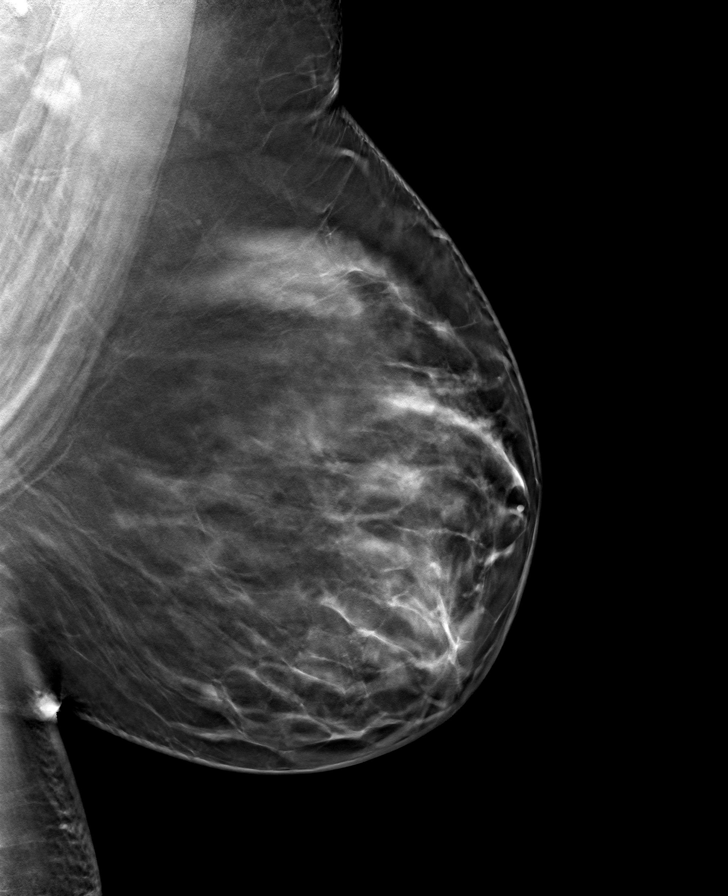

[8 of 24 positions shown; findings below may reference images not displayed]

ACR Breast Density Category b: There are scattered areas of
fibroglandular density.
FINDINGS: In the right breast, a possible new/developing asymmetry visible
only on the CC images warrants further evaluation. In the left
breast, no findings suspicious for malignancy. Images were processed
with CAD.
IMPRESSION: Further evaluation is suggested for possible new/developing
asymmetry in the right breast.

RECOMMENDATION:
Diagnostic mammogram and possibly ultrasound of the right breast.
(Code:[id number]-N-WWO)

The patient will be contacted regarding the findings, and additional
imaging will be scheduled.

BI-RADS CATEGORY  0: Incomplete. Need additional imaging evaluation
and/or prior mammograms for comparison.

## 2022-04-22 ENCOUNTER — Other Ambulatory Visit: Payer: Self-pay | Admitting: Otolaryngology

## 2022-04-22 DIAGNOSIS — C07 Malignant neoplasm of parotid gland: Secondary | ICD-10-CM

## 2022-04-22 DIAGNOSIS — H9312 Tinnitus, left ear: Secondary | ICD-10-CM

## 2022-04-28 ENCOUNTER — Encounter (HOSPITAL_BASED_OUTPATIENT_CLINIC_OR_DEPARTMENT_OTHER): Payer: Self-pay | Admitting: Nurse Practitioner

## 2022-04-28 DIAGNOSIS — F40240 Claustrophobia: Secondary | ICD-10-CM

## 2022-05-02 ENCOUNTER — Other Ambulatory Visit (HOSPITAL_BASED_OUTPATIENT_CLINIC_OR_DEPARTMENT_OTHER): Payer: Self-pay

## 2022-05-02 MED ORDER — ALPRAZOLAM 0.5 MG PO TABS
ORAL_TABLET | ORAL | 0 refills | Status: DC
  Filled 2022-05-02: qty 4, 2d supply, fill #0

## 2022-05-16 ENCOUNTER — Ambulatory Visit
Admission: RE | Admit: 2022-05-16 | Discharge: 2022-05-16 | Disposition: A | Discharge status: Home / Self Care | Payer: 59 | Source: Ambulatory Visit | Attending: Otolaryngology | Admitting: Otolaryngology

## 2022-05-16 DIAGNOSIS — H9312 Tinnitus, left ear: Secondary | ICD-10-CM

## 2022-05-16 DIAGNOSIS — C07 Malignant neoplasm of parotid gland: Secondary | ICD-10-CM

## 2022-05-16 MED ORDER — GADOPICLENOL 0.5 MMOL/ML IV SOLN
10.0000 mL | Freq: Once | INTRAVENOUS | Status: AC | PRN
  Administered 2022-05-16: 10 mL via INTRAVENOUS

## 2022-05-20 ENCOUNTER — Other Ambulatory Visit (HOSPITAL_BASED_OUTPATIENT_CLINIC_OR_DEPARTMENT_OTHER): Payer: Self-pay | Admitting: Family Medicine

## 2022-05-20 DIAGNOSIS — Z1231 Encounter for screening mammogram for malignant neoplasm of breast: Secondary | ICD-10-CM

## 2022-06-04 ENCOUNTER — Other Ambulatory Visit (HOSPITAL_BASED_OUTPATIENT_CLINIC_OR_DEPARTMENT_OTHER): Payer: Self-pay

## 2022-06-04 ENCOUNTER — Other Ambulatory Visit: Payer: Self-pay

## 2022-06-04 ENCOUNTER — Ambulatory Visit
Admission: RE | Admit: 2022-06-04 | Discharge: 2022-06-04 | Disposition: A | Discharge status: Home / Self Care | Payer: 59 | Source: Ambulatory Visit | Attending: Emergency Medicine | Admitting: Emergency Medicine

## 2022-06-04 VITALS — BP 135/89 | HR 81 | Temp 98.2°F | Resp 18 | Ht 66.0 in | Wt 275.0 lb

## 2022-06-04 DIAGNOSIS — H66001 Acute suppurative otitis media without spontaneous rupture of ear drum, right ear: Secondary | ICD-10-CM | POA: Diagnosis not present

## 2022-06-04 DIAGNOSIS — J019 Acute sinusitis, unspecified: Secondary | ICD-10-CM

## 2022-06-04 MED ORDER — DOXYCYCLINE HYCLATE 100 MG PO CAPS
100.0000 mg | ORAL_CAPSULE | Freq: Two times a day (BID) | ORAL | 0 refills | Status: DC
  Filled 2022-06-04: qty 20, 10d supply, fill #0

## 2022-06-04 NOTE — Discharge Instructions (Addendum)
Take antibiotics as prescribed. Take OTC medicine as needed to help with discomfort. Follow-up with PCP or ENT if no improvement after completing medication.

## 2022-06-04 NOTE — ED Provider Notes (Signed)
[facility]    CSN: [id number] Arrival date & time: 06/04/22  1507      History   Chief Complaint Chief Complaint  Patient presents with   Cough    Ear fullness, congestion x2 weeks - Entered by patient   Otalgia    HPI LALITHA ILYAS is a 45 y.o. female.   Patient presents with concerns of feeling unwell for about 2 weeks. She reports nasal congestion, left ear pressure, and cough. She was in [place] and went to an urgent care there who prescribed her a Z-pak and steroids about a week ago and she reports minimal improvement after completing that. She denies headache, body aches, fever, sore throat, dizziness, or nausea/vomiting.   The history is provided by the patient.  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 x 5 yr.   PONV (postoperative nausea and vomiting)     Patient Active Problem List   Diagnosis Date Noted   Genetic testing 12/07/2020   Family history of breast cancer 11/23/2020   Family history of pancreatic cancer 11/23/2020   Family history of uterine cancer 11/23/2020   Family history of prostate cancer 11/23/2020   Body mass index (BMI) 40.0-44.9, adult ([facility]) 10/15/2020   Dyslipidemia 10/15/2020   Hip mass, left 12/08/2017   Plantar fasciitis of left  foot 07/15/2017   Right foot injury 05/07/2016   Right leg pain 01/28/2016   Anxiety 11/19/2015   Spinal stenosis of lumbar region without neurogenic claudication 11/19/2015   TMJ (temporomandibular joint disorder) 11/19/2015   OAB (overactive bladder) 11/19/2015   Essential hypertension 11/19/2015   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    Past Surgical History:  Procedure Laterality Date   CESAREAN SECTION     LIPOMA EXCISION Left 05/31/2018   Procedure: EXCISION OF LEFT HIP LIPOMA;  Surgeon: Greer Pickerel, MD;  Location: [facility];  Service: General;  Laterality: Left;   MICRODISCECTOMY LUMBAR  06/2012   L5   PAROTIDECTOMY Left 05/09/2013   Procedure: LEFT SUPERFICIAL PAROTIDECTOMY;  Surgeon: Jodi Marble, MD;  Location: [facility];  Service: ENT;  Laterality: Left;   PAROTIDECTOMY Left 05/16/2013   Procedure: COMPLETION PAROTIDECTOMY LEFT WITH FACIAL  NERVE PRESERVATION ;  Surgeon: Jodi Marble, MD;  Location: [facility];  Service: ENT;  Laterality: Left;   TUBAL LIGATION     URETHRAL SLING  2016    OB History   No obstetric history on file.      Home Medications    Prior to Admission medications   Medication Sig Start Date End Date Taking? Authorizing Provider  doxycycline (VIBRAMYCIN) 100 MG capsule Take 1 capsule (100 mg total) by mouth 2 (  two) times daily. 06/04/22  Yes Amarii Ruby Logiudice L, PA  buPROPion (WELLBUTRIN XL) 150 MG 24 hr tablet Take 1 tablet by mouth daily. 12/24/21   Orma Render, NP  cholecalciferol (VITAMIN D) 1000 UNITS tablet Take 1,000 Units by mouth daily.    [provider]  ibuprofen (ADVIL,MOTRIN) 200 MG tablet Take 600 mg by mouth every 8 (eight) hours as needed (for pain.).    [provider]  influenza vac split quadrivalent PF (FLUARIX) 0.5 ML injection Inject into the muscle. 03/13/22     lisinopril (ZESTRIL) 20 MG tablet Take 1 tablet (20 mg total) by mouth daily. ** Please call  the office and arrange follow up ** 12/24/21   Early, Coralee Pesa, NP  ondansetron (ZOFRAN) 8 MG tablet Take 1 tablet by mouth, as needed for nausea, up to three times a day 05/08/21     Vitamin D, Ergocalciferol, (DRISDOL) 1.25 MG (50000 UNIT) CAPS capsule Take 1 capsule (50,000 Units total) by mouth every 7 (seven) days. Take for 8 total doses(weeks) 01/01/22   Early, Coralee Pesa, NP    Family History Family History  Problem Relation Age of Onset   Breast cancer Mother        dx early 77s   Uterine cancer Mother        dx mid-late 75s   Diabetes Mother    Pancreatic cancer Mother 24   Prostate cancer Paternal Grandfather        dx late 32s   Breast cancer Other        MGM's sister    Social History Social History   Tobacco Use   Smoking status: Former   Smokeless tobacco: Never   Tobacco comments:    quit smoking 2007  Vaping Use   Vaping Use: Never used  Substance Use Topics   Alcohol use: Yes    Comment: rarely   Drug use: No     Allergies   Keflex [cephalexin] and Sulfa antibiotics   Review of Systems Review of Systems  Constitutional:  Positive for fatigue. Negative for fever.  HENT:  Positive for congestion and ear pain. Negative for sore throat.   Respiratory:  Positive for cough. Negative for shortness of breath.   Gastrointestinal:  Negative for nausea and vomiting.  Musculoskeletal:  Negative for myalgias.  Skin:  Negative for rash.  Neurological:  Negative for dizziness and headaches.     Physical Exam Triage Vital Signs ED Triage Vitals [06/04/22 1532]  Enc Vitals Group     BP 135/89     Pulse Rate 81     Resp 18     Temp 98.2 F (36.8 C)     Temp Source Oral     SpO2 99 %     Weight 275 lb (124.7 kg)     Height '5\' 6"'$  (1.676 m)     Head Circumference      Peak Flow      Pain Score 3     Pain Loc      Pain Edu?      Excl. in [place]?    No data found.  Updated Vital Signs BP 135/89 (BP Location: Left Arm)   Pulse 81   Temp 98.2 F (36.8 C)  (Oral)   Resp 18   Ht '5\' 6"'$  (1.676 m)   Wt 275 lb (124.7 kg)   SpO2 99%   BMI 44.39 kg/m   Visual Acuity Right Eye Distance:   Left Eye  Distance:   Bilateral Distance:    Right Eye Near:   Left Eye Near:    Bilateral Near:     Physical Exam Vitals and nursing note reviewed.  Constitutional:      General: She is not in acute distress. HENT:     Head: Normocephalic.     Right Ear: Tympanic membrane, ear canal and external ear normal.     Left Ear: Ear canal and external ear normal. Tympanic membrane is erythematous.     Nose: Congestion present.     Mouth/Throat:     Mouth: Mucous membranes are moist.     Pharynx: Oropharynx is clear. No oropharyngeal exudate or posterior oropharyngeal erythema.  Eyes:     Conjunctiva/sclera: Conjunctivae normal.     Pupils: Pupils are equal, round, and reactive to light.  Cardiovascular:     Rate and Rhythm: Normal rate and regular rhythm.     Heart sounds: Normal heart sounds.  Pulmonary:     Effort: Pulmonary effort is normal.     Breath sounds: Normal breath sounds.  Lymphadenopathy:     Cervical: No cervical adenopathy.  Neurological:     Mental Status: She is alert.     Gait: Gait normal.  Psychiatric:        Mood and Affect: Mood normal.      UC Treatments / Results  Labs (all labs ordered are listed, but only abnormal results are displayed) Labs Reviewed - No data to display  EKG   Radiology No results found.  Procedures Procedures (including critical care time)  Medications Ordered in UC Medications - No data to display  Initial Impression / Assessment and Plan / UC Course  I have reviewed the triage vital signs and the nursing notes.  Pertinent labs & imaging results that were available during my care of the patient were reviewed by me and considered in my medical decision making (see chart for details).     AOM, also likely sinusitis. Doxycycline due to pcn/keflex allergy.   E/M: 1 acute  uncomplicated illness, no data, moderate risk due to prescription management  Final Clinical Impressions(s) / UC Diagnoses   Final diagnoses:  Acute non-recurrent sinusitis, unspecified location  Non-recurrent acute suppurative otitis media of right ear without spontaneous rupture of tympanic membrane     Discharge Instructions      Take antibiotics as prescribed. Take OTC medicine as needed to help with discomfort. Follow-up with PCP or ENT if no improvement after completing medication.    ED Prescriptions     Medication Sig Dispense Auth. Provider   doxycycline (VIBRAMYCIN) 100 MG capsule Take 1 capsule (100 mg total) by mouth 2 (two) times daily. 20 capsule Abner Greenspan, Tremaine Fuhriman L, PA      PDMP not reviewed this encounter.   Delsa Sale, [place] 06/04/22 1624

## 2022-06-04 NOTE — ED Triage Notes (Signed)
Otalgia, cough, congestion x 2 weeks, took Z-pak, but not better, while in [place] on vacation

## 2022-06-10 DIAGNOSIS — H5213 Myopia, bilateral: Secondary | ICD-10-CM | POA: Diagnosis not present

## 2022-06-10 DIAGNOSIS — H52203 Unspecified astigmatism, bilateral: Secondary | ICD-10-CM | POA: Diagnosis not present

## 2022-06-10 DIAGNOSIS — H524 Presbyopia: Secondary | ICD-10-CM | POA: Diagnosis not present

## 2022-06-20 ENCOUNTER — Ambulatory Visit (HOSPITAL_BASED_OUTPATIENT_CLINIC_OR_DEPARTMENT_OTHER)
Admission: RE | Admit: 2022-06-20 | Discharge: 2022-06-20 | Disposition: A | Discharge status: Home / Self Care | Payer: 59 | Source: Ambulatory Visit | Attending: Family Medicine | Admitting: Family Medicine

## 2022-06-20 DIAGNOSIS — Z1231 Encounter for screening mammogram for malignant neoplasm of breast: Secondary | ICD-10-CM | POA: Insufficient documentation

## 2022-07-11 ENCOUNTER — Encounter: Payer: Self-pay | Admitting: Internal Medicine

## 2022-07-11 ENCOUNTER — Other Ambulatory Visit (HOSPITAL_BASED_OUTPATIENT_CLINIC_OR_DEPARTMENT_OTHER): Payer: Self-pay

## 2022-07-11 DIAGNOSIS — Z9851 Tubal ligation status: Secondary | ICD-10-CM | POA: Diagnosis not present

## 2022-07-11 DIAGNOSIS — Z6841 Body Mass Index (BMI) 40.0 and over, adult: Secondary | ICD-10-CM | POA: Diagnosis not present

## 2022-07-11 DIAGNOSIS — Z803 Family history of malignant neoplasm of breast: Secondary | ICD-10-CM | POA: Diagnosis not present

## 2022-07-11 DIAGNOSIS — Z01419 Encounter for gynecological examination (general) (routine) without abnormal findings: Secondary | ICD-10-CM | POA: Diagnosis not present

## 2022-07-11 DIAGNOSIS — Z1151 Encounter for screening for human papillomavirus (HPV): Secondary | ICD-10-CM | POA: Diagnosis not present

## 2022-07-11 DIAGNOSIS — Z8742 Personal history of other diseases of the female genital tract: Secondary | ICD-10-CM | POA: Diagnosis not present

## 2022-07-11 DIAGNOSIS — Z87891 Personal history of nicotine dependence: Secondary | ICD-10-CM | POA: Diagnosis not present

## 2022-07-11 DIAGNOSIS — Z8589 Personal history of malignant neoplasm of other organs and systems: Secondary | ICD-10-CM | POA: Diagnosis not present

## 2022-07-11 DIAGNOSIS — Z800 Family history of malignant neoplasm of digestive organs: Secondary | ICD-10-CM | POA: Diagnosis not present

## 2022-07-11 DIAGNOSIS — Z79899 Other long term (current) drug therapy: Secondary | ICD-10-CM | POA: Diagnosis not present

## 2022-07-11 LAB — RESULTS CONSOLE HPV: CHL HPV: NEGATIVE

## 2022-07-11 LAB — HM PAP SMEAR
HM Pap smear: NEGATIVE
HPV 16/18/45 genotyping: NEGATIVE
HPV, high-risk: NEGATIVE

## 2022-07-11 MED ORDER — PROGESTERONE 200 MG PO CAPS
400.0000 mg | ORAL_CAPSULE | Freq: Every evening | ORAL | 5 refills | Status: DC
  Filled 2022-07-11 – 2022-08-13 (×2): qty 20, 10d supply, fill #0

## 2022-07-14 ENCOUNTER — Other Ambulatory Visit (HOSPITAL_BASED_OUTPATIENT_CLINIC_OR_DEPARTMENT_OTHER): Payer: Self-pay

## 2022-07-22 DIAGNOSIS — L738 Other specified follicular disorders: Secondary | ICD-10-CM | POA: Diagnosis not present

## 2022-07-22 DIAGNOSIS — L814 Other melanin hyperpigmentation: Secondary | ICD-10-CM | POA: Diagnosis not present

## 2022-07-22 DIAGNOSIS — L723 Sebaceous cyst: Secondary | ICD-10-CM | POA: Diagnosis not present

## 2022-07-22 DIAGNOSIS — D239 Other benign neoplasm of skin, unspecified: Secondary | ICD-10-CM | POA: Diagnosis not present

## 2022-07-22 DIAGNOSIS — L578 Other skin changes due to chronic exposure to nonionizing radiation: Secondary | ICD-10-CM | POA: Diagnosis not present

## 2022-07-22 DIAGNOSIS — D229 Melanocytic nevi, unspecified: Secondary | ICD-10-CM | POA: Diagnosis not present

## 2022-07-22 DIAGNOSIS — Z86018 Personal history of other benign neoplasm: Secondary | ICD-10-CM | POA: Diagnosis not present

## 2022-07-22 DIAGNOSIS — L821 Other seborrheic keratosis: Secondary | ICD-10-CM | POA: Diagnosis not present

## 2022-07-22 DIAGNOSIS — D1801 Hemangioma of skin and subcutaneous tissue: Secondary | ICD-10-CM | POA: Diagnosis not present

## 2022-07-22 DIAGNOSIS — L813 Cafe au lait spots: Secondary | ICD-10-CM | POA: Diagnosis not present

## 2022-08-13 ENCOUNTER — Other Ambulatory Visit (HOSPITAL_BASED_OUTPATIENT_CLINIC_OR_DEPARTMENT_OTHER): Payer: Self-pay

## 2022-08-23 ENCOUNTER — Other Ambulatory Visit (HOSPITAL_BASED_OUTPATIENT_CLINIC_OR_DEPARTMENT_OTHER): Payer: Self-pay

## 2022-08-26 ENCOUNTER — Other Ambulatory Visit (HOSPITAL_BASED_OUTPATIENT_CLINIC_OR_DEPARTMENT_OTHER): Payer: Self-pay

## 2022-08-26 MED ORDER — PROGESTERONE 200 MG PO CAPS
400.0000 mg | ORAL_CAPSULE | Freq: Every day | ORAL | 0 refills | Status: DC
  Filled 2022-08-26: qty 20, 10d supply, fill #0

## 2022-09-11 DIAGNOSIS — E28319 Asymptomatic premature menopause: Secondary | ICD-10-CM | POA: Diagnosis not present

## 2022-09-12 ENCOUNTER — Other Ambulatory Visit (HOSPITAL_BASED_OUTPATIENT_CLINIC_OR_DEPARTMENT_OTHER): Payer: Self-pay

## 2022-09-12 MED ORDER — PROGESTERONE 200 MG PO CAPS
400.0000 mg | ORAL_CAPSULE | Freq: Every evening | ORAL | 5 refills | Status: DC
  Filled 2022-09-12 – 2022-11-23 (×2): qty 20, 10d supply, fill #0
  Filled 2023-08-08: qty 20, 10d supply, fill #1

## 2022-09-16 ENCOUNTER — Other Ambulatory Visit (HOSPITAL_BASED_OUTPATIENT_CLINIC_OR_DEPARTMENT_OTHER): Payer: Self-pay

## 2022-09-19 ENCOUNTER — Other Ambulatory Visit (HOSPITAL_BASED_OUTPATIENT_CLINIC_OR_DEPARTMENT_OTHER): Payer: Self-pay

## 2022-09-24 ENCOUNTER — Other Ambulatory Visit: Payer: Self-pay

## 2022-09-24 ENCOUNTER — Encounter: Payer: Self-pay | Admitting: Nurse Practitioner

## 2022-09-24 ENCOUNTER — Ambulatory Visit (HOSPITAL_BASED_OUTPATIENT_CLINIC_OR_DEPARTMENT_OTHER)
Admission: RE | Admit: 2022-09-24 | Discharge: 2022-09-24 | Disposition: A | Discharge status: Home / Self Care | Payer: 59 | Source: Ambulatory Visit | Attending: Nurse Practitioner | Admitting: Nurse Practitioner

## 2022-09-24 ENCOUNTER — Other Ambulatory Visit (HOSPITAL_BASED_OUTPATIENT_CLINIC_OR_DEPARTMENT_OTHER): Payer: Self-pay

## 2022-09-24 DIAGNOSIS — J069 Acute upper respiratory infection, unspecified: Secondary | ICD-10-CM

## 2022-09-24 DIAGNOSIS — R059 Cough, unspecified: Secondary | ICD-10-CM

## 2022-09-24 MED ORDER — AZITHROMYCIN 250 MG PO TABS
ORAL_TABLET | ORAL | 0 refills | Status: AC
  Filled 2022-09-24: qty 6, 5d supply, fill #0

## 2022-09-24 MED ORDER — PREDNISONE 20 MG PO TABS
40.0000 mg | ORAL_TABLET | Freq: Every day | ORAL | 0 refills | Status: DC
  Filled 2022-09-24: qty 10, 5d supply, fill #0

## 2022-09-24 MED ORDER — ALBUTEROL SULFATE HFA 108 (90 BASE) MCG/ACT IN AERS
2.0000 | INHALATION_SPRAY | Freq: Four times a day (QID) | RESPIRATORY_TRACT | 0 refills | Status: DC | PRN
  Filled 2022-09-24: qty 6.7, 25d supply, fill #0

## 2022-09-25 ENCOUNTER — Other Ambulatory Visit: Payer: Self-pay

## 2022-09-25 ENCOUNTER — Other Ambulatory Visit (HOSPITAL_BASED_OUTPATIENT_CLINIC_OR_DEPARTMENT_OTHER): Payer: Self-pay

## 2022-11-06 ENCOUNTER — Telehealth: Payer: 59 | Admitting: Nurse Practitioner

## 2022-11-06 ENCOUNTER — Encounter: Payer: Self-pay | Admitting: Nurse Practitioner

## 2022-11-06 ENCOUNTER — Other Ambulatory Visit (HOSPITAL_BASED_OUTPATIENT_CLINIC_OR_DEPARTMENT_OTHER): Payer: Self-pay

## 2022-11-06 DIAGNOSIS — R112 Nausea with vomiting, unspecified: Secondary | ICD-10-CM

## 2022-11-06 MED ORDER — ONDANSETRON 4 MG PO TBDP
4.0000 mg | ORAL_TABLET | Freq: Three times a day (TID) | ORAL | 0 refills | Status: DC | PRN
  Filled 2022-11-06: qty 20, 7d supply, fill #0

## 2022-11-06 NOTE — Progress Notes (Signed)
E-Visit for Nausea and Vomiting   We are sorry that you are not feeling well. Here is how we plan to help!  Based on what you have shared with me it looks like you have a Virus that is irritating your GI tract.  Vomiting is the forceful emptying of a portion of the stomach's content through the mouth.  Although nausea and vomiting can make you feel miserable, it's important to remember that these are not diseases, but rather symptoms of an underlying illness.  When we treat short term symptoms, we always caution that any symptoms that persist should be fully evaluated in a medical office.  I have prescribed a medication that will help alleviate your symptoms and allow you to stay hydrated:  Zofran 4 mg 1 tablet every 8 hours as needed for nausea and vomiting  HOME CARE: Drink clear liquids.  This is very important! Dehydration (the lack of fluid) can lead to a serious complication.  Start off with 1 tablespoon every 5 minutes for 8 hours. You may begin eating bland foods after 8 hours without vomiting.  Start with saltine crackers, white bread, rice, mashed potatoes, applesauce. After 48 hours on a bland diet, you may resume a normal diet. Try to go to sleep.  Sleep often empties the stomach and relieves the need to vomit.  GET HELP RIGHT AWAY IF:  Your symptoms do not improve or worsen within 2 days after treatment. You have a fever for over 3 days. You cannot keep down fluids after trying the medication.  MAKE SURE YOU:  Understand these instructions. Will watch your condition. Will get help right away if you are not doing well or get worse.    Thank you for choosing an e-visit.  Your e-visit answers were reviewed by a board certified advanced clinical practitioner to complete your personal care plan. Depending upon the condition, your plan could have included both over the counter or prescription medications.  Please review your pharmacy choice. Make sure the pharmacy is open so  you can pick up prescription now. If there is a problem, you may contact your provider through MyChart messaging and have the prescription routed to another pharmacy.  Your safety is important to us. If you have drug allergies check your prescription carefully.   For the next 24 hours you can use MyChart to ask questions about today's visit, request a non-urgent call back, or ask for a work or school excuse. You will get an email in the next two days asking about your experience. I hope that your e-visit has been valuable and will speed your recovery.   Meds ordered this encounter  Medications   ondansetron (ZOFRAN-ODT) 4 MG disintegrating tablet    Sig: Take 1 tablet (4 mg total) by mouth every 8 (eight) hours as needed for nausea or vomiting.    Dispense:  20 tablet    Refill:  0    I spent approximately 5 minutes reviewing the patient's history, current symptoms and coordinating their care today.   

## 2022-11-07 ENCOUNTER — Other Ambulatory Visit (HOSPITAL_BASED_OUTPATIENT_CLINIC_OR_DEPARTMENT_OTHER): Payer: Self-pay

## 2022-11-07 MED ORDER — PROMETHAZINE HCL 25 MG PO TABS
25.0000 mg | ORAL_TABLET | Freq: Three times a day (TID) | ORAL | 0 refills | Status: DC | PRN
  Filled 2022-11-07 – 2022-11-08 (×2): qty 20, 7d supply, fill #0

## 2022-11-07 MED ORDER — PROMETHAZINE HCL 25 MG RE SUPP
25.0000 mg | Freq: Four times a day (QID) | RECTAL | 0 refills | Status: DC | PRN
  Filled 2022-11-07 – 2022-11-08 (×2): qty 12, 3d supply, fill #0

## 2022-11-07 MED ORDER — ONDANSETRON 4 MG PO TBDP
4.0000 mg | ORAL_TABLET | Freq: Three times a day (TID) | ORAL | 0 refills | Status: DC | PRN
  Filled 2022-11-07: qty 20, 7d supply, fill #0

## 2022-11-08 ENCOUNTER — Other Ambulatory Visit (HOSPITAL_BASED_OUTPATIENT_CLINIC_OR_DEPARTMENT_OTHER): Payer: Self-pay

## 2022-11-10 ENCOUNTER — Other Ambulatory Visit (HOSPITAL_BASED_OUTPATIENT_CLINIC_OR_DEPARTMENT_OTHER): Payer: Self-pay

## 2022-11-18 ENCOUNTER — Other Ambulatory Visit (HOSPITAL_BASED_OUTPATIENT_CLINIC_OR_DEPARTMENT_OTHER): Payer: Self-pay

## 2022-11-23 ENCOUNTER — Other Ambulatory Visit (HOSPITAL_BASED_OUTPATIENT_CLINIC_OR_DEPARTMENT_OTHER): Payer: Self-pay

## 2022-12-04 ENCOUNTER — Encounter: Payer: Self-pay | Admitting: Nurse Practitioner

## 2022-12-08 ENCOUNTER — Other Ambulatory Visit (HOSPITAL_BASED_OUTPATIENT_CLINIC_OR_DEPARTMENT_OTHER): Payer: Self-pay

## 2022-12-08 ENCOUNTER — Ambulatory Visit
Admission: RE | Admit: 2022-12-08 | Discharge: 2022-12-08 | Disposition: A | Discharge status: Home / Self Care | Payer: 59 | Source: Ambulatory Visit | Attending: Family Medicine | Admitting: Family Medicine

## 2022-12-08 VITALS — BP 121/82 | HR 80 | Temp 98.2°F | Resp 18 | Ht 66.0 in | Wt 275.0 lb

## 2022-12-08 DIAGNOSIS — N3001 Acute cystitis with hematuria: Secondary | ICD-10-CM

## 2022-12-08 LAB — POCT URINALYSIS DIP (MANUAL ENTRY)
Bilirubin, UA: NEGATIVE
Glucose, UA: NEGATIVE mg/dL
Ketones, POC UA: NEGATIVE mg/dL
Nitrite, UA: NEGATIVE
Protein Ur, POC: NEGATIVE mg/dL
Spec Grav, UA: 1.015 (ref 1.010–1.025)
Urobilinogen, UA: 0.2 E.U./dL
pH, UA: 5.5 (ref 5.0–8.0)

## 2022-12-08 MED ORDER — FLUCONAZOLE 200 MG PO TABS
ORAL_TABLET | ORAL | 0 refills | Status: DC
  Filled 2022-12-08: qty 7, 21d supply, fill #0

## 2022-12-08 MED ORDER — NITROFURANTOIN MONOHYD MACRO 100 MG PO CAPS
100.0000 mg | ORAL_CAPSULE | Freq: Two times a day (BID) | ORAL | 0 refills | Status: AC
  Filled 2022-12-08: qty 20, 10d supply, fill #0

## 2022-12-08 NOTE — ED Triage Notes (Signed)
Patient c/o urinary frequency and urgency x 3 days.  Denies any hematuria.  Patient has taken Pyridum.

## 2022-12-08 NOTE — Discharge Instructions (Addendum)
Advised patient UA revealed UTI.  Advised patient to take medication as directed with food to completion.  Encouraged increase daily water intake to 64 ounces per day while taking this medication.  Advised if symptoms worsen and/or unresolved please follow-up with PCP, urology or here for further evaluation.  Advised we will follow-up with urine culture results once received.

## 2022-12-08 NOTE — ED Provider Notes (Signed)
[facility]    CSN: [id number] Arrival date & time: 12/08/22  1344      History   Chief Complaint Chief Complaint  Patient presents with   Urinary Frequency    Entered by patient    HPI [name] [name] is a 47 y.o. female.   HPI 47 year old female presents with urinary frequency and urgency for 3 days.  Reports taking OTC Pyridium for symptoms.  PMH significant for morbid obesity, HTN, and cancer of parathyroid gland.  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 x 5 yr.   PONV (postoperative nausea and vomiting)     Patient Active Problem List   Diagnosis Date Noted   Genetic testing 12/07/2020   Family history of breast cancer 11/23/2020   Family history of pancreatic cancer 11/23/2020   Family history of uterine cancer 11/23/2020   Family history of prostate cancer 11/23/2020   Body mass index (BMI) 40.0-44.9, adult ([facility]) 10/15/2020   Dyslipidemia 10/15/2020   Hip mass, left 12/08/2017   Plantar fasciitis of left foot 07/15/2017   Right foot injury 05/07/2016   Right leg pain 01/28/2016   Anxiety 11/19/2015   Spinal stenosis of lumbar region without neurogenic claudication 11/19/2015   TMJ (temporomandibular joint disorder) 11/19/2015   OAB (overactive bladder) 11/19/2015   Essential hypertension 11/19/2015   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    Past Surgical History:  Procedure Laterality Date   CESAREAN SECTION     LIPOMA EXCISION Left 05/31/2018   Procedure: EXCISION OF LEFT HIP LIPOMA;  Surgeon: Gaynelle Adu, MD;  Location: [facility];  Service: General;  Laterality: Left;   MICRODISCECTOMY LUMBAR  06/2012   L5   PAROTIDECTOMY Left 05/09/2013   Procedure: LEFT SUPERFICIAL PAROTIDECTOMY;  Surgeon: Flo Shanks, MD;  Location: [facility];  Service: ENT;  Laterality: Left;   PAROTIDECTOMY Left 05/16/2013   Procedure: COMPLETION PAROTIDECTOMY LEFT WITH FACIAL  NERVE PRESERVATION ;  Surgeon: Flo Shanks, MD;  Location: [facility];  Service: ENT;  Laterality: Left;   TUBAL LIGATION     URETHRAL SLING  2016    OB History   No obstetric history on file.      Home Medications    Prior to Admission medications   Medication Sig Start Date End Date Taking? Authorizing Provider  albuterol (VENTOLIN HFA) 108 (90 Base) MCG/ACT inhaler Inhale 2 puffs into the lungs every 6 (six) hours as needed for wheezing or shortness of breath. 09/24/22  Yes Early, Sung Amabile, NP  buPROPion (WELLBUTRIN XL) 150 MG 24 hr tablet Take 1 tablet by mouth daily. 12/24/21  Yes Early, Sung Amabile, NP  cholecalciferol (VITAMIN D) 1000 UNITS tablet Take 1,000 Units by mouth daily.   Yes [provider]  doxycycline (VIBRAMYCIN) 100 MG capsule Take 1 capsule (100 mg total) by mouth 2 (two) times daily. 06/04/22  Yes Arruda, Amy L, PA  fluconazole (DIFLUCAN) 200 MG tablet Take 1 tablet by mouth now, may repeat 1  tablet in 3 days if symptoms are not resolved. 12/08/22  Yes Trevor Iha, FNP  ibuprofen (ADVIL,MOTRIN) 200 MG tablet Take 600 mg by mouth every 8 (eight) hours as needed (for pain.).   Yes [provider]  influenza vac split quadrivalent PF (FLUARIX) 0.5 ML injection Inject into the muscle. 03/13/22  Yes   lisinopril (ZESTRIL) 20 MG tablet Take 1 tablet (20 mg total) by mouth daily. ** Please call the office and arrange follow up ** 12/24/21  Yes Early, Sung Amabile, NP  nitrofurantoin, macrocrystal-monohydrate, (MACROBID) 100 MG capsule Take 1 capsule (100 mg total) by mouth 2 (two) times daily for 10  days. 12/08/22 12/18/22 Yes Trevor Iha, FNP  ondansetron (ZOFRAN-ODT) 4 MG disintegrating tablet Take 1 tablet (4 mg total) by mouth every 8 (eight) hours as needed for nausea or vomiting. 11/07/22  Yes Early, Sung Amabile, NP  predniSONE (DELTASONE) 20 MG tablet Take 2 tablets (40 mg total) by mouth daily with breakfast. 09/24/22  Yes Early, Sung Amabile, NP  progesterone (PROMETRIUM) 200 MG capsule Take 2 capsules by mouth nightly for 10 days 07/11/22  Yes Louk, Vicente Males., MD  progesterone (PROMETRIUM) 200 MG capsule Take 2 capsules (400 mg total) by mouth Nightly for 10 days. 09/12/22  Yes   promethazine (PHENERGAN) 25 MG suppository Place 1 suppository (25 mg total) rectally every 6 (six) hours as needed for nausea or vomiting. Do not take within 6 hours of oral promethazine. 11/07/22  Yes Early, Sung Amabile, NP  promethazine (PHENERGAN) 25 MG tablet Take 1 tablet (25 mg total) by mouth every 8 (eight) hours as needed for nausea or vomiting. 11/07/22  Yes Early, Sung Amabile, NP  Vitamin D, Ergocalciferol, (DRISDOL) 1.25 MG (50000 UNIT) CAPS capsule Take 1 capsule (50,000 Units total) by mouth every 7 (seven) days. Take for 8 total doses(weeks) 01/01/22  Yes Early, Sung Amabile, NP  progesterone (PROMETRIUM) 200 MG capsule Take 2 capsules (400 mg total) by mouth at bedtime for 10 days. 08/26/22 09/05/22  Laurann Montana., MD    Family History Family History  Problem Relation Age of Onset   Breast cancer Mother        dx early 34s   Uterine cancer Mother        dx mid-late 31s   Diabetes Mother    Pancreatic cancer Mother 55   Prostate cancer Paternal Grandfather        dx late 50s   Breast cancer Other        MGM's sister    Social History Social History   Tobacco Use   Smoking status: Former   Smokeless tobacco: Never   Tobacco comments:    quit smoking 2007  Vaping Use   Vaping Use: Never used  Substance Use Topics   Alcohol use: Yes    Comment: rarely   Drug use: No     Allergies   Keflex [cephalexin]  and Sulfa antibiotics   Review of Systems Review of Systems  Genitourinary:  Positive for frequency and urgency.  All other systems reviewed and are negative.    Physical Exam Triage Vital Signs ED Triage Vitals  Enc Vitals Group     BP 12/08/22 1411 121/82     Pulse Rate 12/08/22 1411 80     Resp 12/08/22 1411 18     Temp 12/08/22 1411 98.2 F (36.8 C)     Temp Source 12/08/22 1411 Oral     SpO2 12/08/22 1411 97 %  Weight 12/08/22 1413 275 lb (124.7 kg)     Height 12/08/22 1413 5\' 6"  (1.676 m)     Head Circumference --      Peak Flow --      Pain Score 12/08/22 1413 2     Pain Loc --      Pain Edu? --      Excl. in [place]? --    No data found.  Updated Vital Signs BP 121/82 (BP Location: Right Arm)   Pulse 80   Temp 98.2 F (36.8 C) (Oral)   Resp 18   Ht 5\' 6"  (1.676 m)   Wt 275 lb (124.7 kg)   LMP 11/27/2022 (Exact Date)   SpO2 97%   BMI 44.39 kg/m      Physical Exam Vitals and nursing note reviewed.  Constitutional:      Appearance: Normal appearance. She is obese.  HENT:     Head: Normocephalic and atraumatic.     Mouth/Throat:     Mouth: Mucous membranes are moist.     Pharynx: Oropharynx is clear.  Eyes:     Extraocular Movements: Extraocular movements intact.     Conjunctiva/sclera: Conjunctivae normal.     Pupils: Pupils are equal, round, and reactive to light.  Cardiovascular:     Rate and Rhythm: Normal rate and regular rhythm.     Pulses: Normal pulses.     Heart sounds: Normal heart sounds.  Pulmonary:     Effort: Pulmonary effort is normal.     Breath sounds: Normal breath sounds. No wheezing, rhonchi or rales.  Abdominal:     Tenderness: There is no right CVA tenderness or left CVA tenderness.  Musculoskeletal:        General: Normal range of motion.     Cervical back: Normal range of motion and neck supple.  Skin:    General: Skin is warm and dry.  Neurological:     General: No focal deficit present.     Mental Status: She is  alert and oriented to person, place, and time. Mental status is at baseline.  Psychiatric:        Mood and Affect: Mood normal.        Behavior: Behavior normal.        Thought Content: Thought content normal.      UC Treatments / Results  Labs (all labs ordered are listed, but only abnormal results are displayed) Labs Reviewed  POCT URINALYSIS DIP (MANUAL ENTRY) - Abnormal; Notable for the following components:      Result Value   Blood, UA trace-intact (*)    Leukocytes, UA Trace (*)    All other components within normal limits  URINE CULTURE    EKG   Radiology No results found.  Procedures Procedures (including critical care time)  Medications Ordered in UC Medications - No data to display  Initial Impression / Assessment and Plan / UC Course  I have reviewed the triage vital signs and the nursing notes.  Pertinent labs & imaging results that were available during my care of the patient were reviewed by me and considered in my medical decision making (see chart for details).     MDM: 1.  Acute cystitis with hematuria-Rx'd Macrobid 100 mg capsule twice daily x 10 days, Diflucan 200 mg tablet take 1 tablet now may repeat in 3 days if symptoms or not resolved #7. Advised patient UA revealed UTI.  Advised patient to take medication as directed with food to completion.  Encouraged increase daily water intake to 64 ounces per day while taking this medication.  Advised if symptoms worsen and/or unresolved please follow-up with PCP, urology or here for further evaluation.  Advised we will follow-up with urine culture results once received.  Patient discharged home, hemodynamically stable   Final Clinical Impressions(s) / UC Diagnoses   Final diagnoses:  Acute cystitis with hematuria     Discharge Instructions      Advised patient UA revealed UTI.  Advised patient to take medication as directed with food to completion.  Encouraged increase daily water intake to 64  ounces per day while taking this medication.  Advised if symptoms worsen and/or unresolved please follow-up with PCP, urology or here for further evaluation.  Advised we will follow-up with urine culture results once received.     ED Prescriptions     Medication Sig Dispense Auth. Provider   nitrofurantoin, macrocrystal-monohydrate, (MACROBID) 100 MG capsule Take 1 capsule (100 mg total) by mouth 2 (two) times daily for 10 days. 20 capsule Trevor Iha, FNP   fluconazole (DIFLUCAN) 200 MG tablet Take 1 tablet by mouth now, may repeat 1 tablet in 3 days if symptoms are not resolved. 7 tablet Trevor Iha, FNP      PDMP not reviewed this encounter.   Trevor Iha, FNP 12/08/22 1447

## 2022-12-10 LAB — URINE CULTURE: Culture: 80000 — AB

## 2023-01-14 ENCOUNTER — Ambulatory Visit (HOSPITAL_BASED_OUTPATIENT_CLINIC_OR_DEPARTMENT_OTHER): Payer: 59 | Admitting: Orthopaedic Surgery

## 2023-02-09 ENCOUNTER — Other Ambulatory Visit (HOSPITAL_BASED_OUTPATIENT_CLINIC_OR_DEPARTMENT_OTHER): Payer: Self-pay

## 2023-02-12 ENCOUNTER — Encounter: Payer: Self-pay | Admitting: Nurse Practitioner

## 2023-02-12 ENCOUNTER — Other Ambulatory Visit (HOSPITAL_BASED_OUTPATIENT_CLINIC_OR_DEPARTMENT_OTHER): Payer: Self-pay

## 2023-02-12 ENCOUNTER — Ambulatory Visit: Payer: 59 | Admitting: Nurse Practitioner

## 2023-02-12 VITALS — BP 124/82 | HR 78 | Ht 66.25 in | Wt 286.8 lb

## 2023-02-12 DIAGNOSIS — E785 Hyperlipidemia, unspecified: Secondary | ICD-10-CM

## 2023-02-12 DIAGNOSIS — R7303 Prediabetes: Secondary | ICD-10-CM | POA: Diagnosis not present

## 2023-02-12 DIAGNOSIS — I10 Essential (primary) hypertension: Secondary | ICD-10-CM | POA: Diagnosis not present

## 2023-02-12 DIAGNOSIS — R112 Nausea with vomiting, unspecified: Secondary | ICD-10-CM | POA: Diagnosis not present

## 2023-02-12 DIAGNOSIS — E559 Vitamin D deficiency, unspecified: Secondary | ICD-10-CM

## 2023-02-12 DIAGNOSIS — Z0000 Encounter for general adult medical examination without abnormal findings: Secondary | ICD-10-CM | POA: Diagnosis not present

## 2023-02-12 DIAGNOSIS — Z6841 Body Mass Index (BMI) 40.0 and over, adult: Secondary | ICD-10-CM

## 2023-02-12 MED ORDER — LISINOPRIL 20 MG PO TABS
20.0000 mg | ORAL_TABLET | Freq: Every day | ORAL | 3 refills | Status: DC
  Filled 2023-02-12: qty 90, 90d supply, fill #0
  Filled 2023-05-25: qty 90, 90d supply, fill #1
  Filled 2023-08-19: qty 90, 90d supply, fill #2
  Filled 2023-12-04: qty 90, 90d supply, fill #3

## 2023-02-12 MED ORDER — PHENTERMINE HCL 37.5 MG PO TABS
37.5000 mg | ORAL_TABLET | Freq: Every day | ORAL | 0 refills | Status: DC
  Filled 2023-02-12: qty 90, 90d supply, fill #0

## 2023-02-12 MED ORDER — ONDANSETRON 4 MG PO TBDP
4.0000 mg | ORAL_TABLET | Freq: Three times a day (TID) | ORAL | 5 refills | Status: DC | PRN
  Filled 2023-02-12: qty 20, 7d supply, fill #0
  Filled 2023-09-30: qty 20, 7d supply, fill #1

## 2023-02-12 NOTE — Progress Notes (Signed)
[name] Clamp, DNP, AGNP-c [facility] [address] [place], [place] [address] Main Office [phone]  BP 124/82   Pulse 78   Ht 5' 6.25" (1.683 m)   Wt 286 lb 12.8 oz (130.1 kg)   LMP 02/05/2023   BMI 45.94 kg/m    Subjective:    Patient ID: [name] [name], female    DOB: 12/13/1975, 47 y.o.   MRN: [id number]  HPI: [name] [name] is a 47 y.o. female presenting on 02/12/2023 for comprehensive medical examination.   Current medical concerns include: Weight management- [name] [name] expresses concerns over her weight. She has been on wellbutrin and Ozempic in the past with success with Ozempic. Unfortunately, she lost coverage of this medication and since then she tells me she has not been able to manage her weight. She has concerns that this is affecting her lipids and blood sugar.  Pertinent items are noted in HPI.  IMMUNIZATIONS:   Flu: Flu vaccine postponed until flu season Prevnar 13: Prevnar 13 N/A for this patient Prevnar 20: Prevnar 20 N/A for this patient Pneumovax 23: Pneumovax 23 N/A for this patient Vac Shingrix: Shingrix N/A for this patient HPV: HPV N/A for this patient Tetanus: Tetanus completed in the last 10 years COVID: COVID completed, documentation in chart   HEALTH MAINTENANCE: Pap Smear HM Status: is up to date Mammogram HM Status: is not applicable for this patient Colon Cancer Screening HM Status: is not applicable for this patient Bone Density HM Status: is not applicable for this patient STI Testing HM Status: was declined  Lung CT HM Status: is not applicable for this patient  She denies concerns with vision, hearing, or dentition.  She monitors her diet closely and is working to incorporate more exercise.    Most Recent Depression Screen:     02/12/2023    3:10 PM 10/15/2020   12:46 PM  Depression screen PHQ 2/9  Decreased Interest 0 0  Down, Depressed, Hopeless 0 0  PHQ - 2 Score 0 0   Most Recent Anxiety Screen:      No  data to display         Most Recent Fall Screen:    02/12/2023    3:09 PM 12/25/2021    7:49 PM  Fall Risk   Falls in the past year? 0 0  Number falls in past yr: 0 0  Injury with Fall? 0 0  Risk for fall due to : No Fall Risks No Fall Risks  Follow up Falls evaluation completed Falls evaluation completed    Past medical history, surgical history, medications, allergies, family history and social history reviewed with patient today and changes made to appropriate areas of the chart.  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 x 5 yr.   PONV (postoperative nausea and vomiting)    Medications:  Current Outpatient Medications on File Prior to Visit  Medication Sig   buPROPion (WELLBUTRIN XL) 150  MG 24 hr tablet Take 1 tablet by mouth daily.   cholecalciferol (VITAMIN D) 1000 UNITS tablet Take 1,000 Units by mouth daily.   ibuprofen (ADVIL,MOTRIN) 200 MG tablet Take 600 mg by mouth every 8 (eight) hours as needed (for pain.).   progesterone (PROMETRIUM) 200 MG capsule Take 2 capsules by mouth nightly for 10 days   progesterone (PROMETRIUM) 200 MG capsule Take 2 capsules (400 mg total) by mouth Nightly for 10 days.   influenza vac split quadrivalent PF (FLUARIX) 0.5 ML injection Inject into the muscle. (Patient not taking: Reported on 02/12/2023)   No current facility-administered medications on file prior to visit.   Surgical History:  Past Surgical History:  Procedure Laterality Date   CESAREAN SECTION     LIPOMA EXCISION Left  05/31/2018   Procedure: EXCISION OF LEFT HIP LIPOMA;  Surgeon: Gaynelle Adu, MD;  Location: [facility];  Service: General;  Laterality: Left;   MICRODISCECTOMY LUMBAR  06/2012   L5   PAROTIDECTOMY Left 05/09/2013   Procedure: LEFT SUPERFICIAL PAROTIDECTOMY;  Surgeon: Flo Shanks, MD;  Location: [facility];  Service: ENT;  Laterality: Left;   PAROTIDECTOMY Left 05/16/2013   Procedure: COMPLETION PAROTIDECTOMY LEFT WITH FACIAL  NERVE PRESERVATION ;  Surgeon: Flo Shanks, MD;  Location: [facility];  Service: ENT;  Laterality: Left;   SPINE SURGERY     TUBAL LIGATION     URETHRAL SLING  2016   Allergies:  Allergies  Allergen Reactions   Keflex [Cephalexin] Rash   Sulfa Antibiotics Rash   Family History:  Family History  Problem Relation Age of Onset   Breast cancer Mother        dx [name] [name] 37s   Uterine cancer Mother        dx mid-late 25s   Diabetes Mother    Pancreatic cancer Mother 85   Prostate cancer Paternal Grandfather        dx late 75s   Breast cancer Other        MGM's sister       Objective:    BP 124/82   Pulse 78   Ht 5' 6.25" (1.683 m)   Wt 286 lb 12.8 oz (130.1 kg)   LMP 02/05/2023   BMI 45.94 kg/m   Wt Readings from Last 3 Encounters:  02/12/23 286 lb 12.8 oz (130.1 kg)  12/08/22 275 lb (124.7 kg)  06/04/22 275 lb (124.7 kg)    Physical Exam Vitals and nursing note reviewed.  Constitutional:      General: She is not in acute distress.    Appearance: Normal appearance. She is obese.  HENT:     Head: Normocephalic and atraumatic.     Right Ear: Hearing, tympanic membrane, ear canal and external ear normal.     Left Ear: Hearing, tympanic membrane, ear canal and external ear normal.     Nose: Nose normal.     Right Sinus: No maxillary sinus tenderness or frontal sinus tenderness.     Left Sinus: No maxillary sinus tenderness or frontal sinus tenderness.     Mouth/Throat:     Lips: Pink.     Mouth:  Mucous membranes are moist.     Pharynx: Oropharynx is clear.  Eyes:     General: Lids are normal. Vision grossly intact.     Extraocular Movements: Extraocular movements intact.     Conjunctiva/sclera: Conjunctivae normal.     Pupils: Pupils are equal, round, and reactive to light.  Funduscopic exam:    Right eye: Red reflex present.        Left eye: Red reflex present.    Visual Fields: Right eye visual fields normal and left eye visual fields normal.  Neck:     Thyroid: No thyromegaly.     Vascular: No carotid bruit.  Cardiovascular:     Rate and Rhythm: Normal rate and regular rhythm.     Chest Wall: PMI is not displaced.     Pulses: Normal pulses.          Dorsalis pedis pulses are 2+ on the right side and 2+ on the left side.       Posterior tibial pulses are 2+ on the right side and 2+ on the left side.     Heart sounds: Normal heart sounds. No murmur heard. Pulmonary:     Effort: Pulmonary effort is normal. No respiratory distress.     Breath sounds: Normal breath sounds.  Abdominal:     General: Abdomen is flat. Bowel sounds are normal. There is no distension.     Palpations: Abdomen is soft. There is no hepatomegaly, splenomegaly or mass.     Tenderness: There is no abdominal tenderness. There is no right CVA tenderness, left CVA tenderness, guarding or rebound.  Musculoskeletal:        General: Normal range of motion.     Cervical back: Full passive range of motion without pain, normal range of motion and neck supple. No tenderness.     Right lower leg: No edema.     Left lower leg: No edema.  Feet:     Left foot:     Toenail Condition: Left toenails are normal.  Lymphadenopathy:     Cervical: No cervical adenopathy.     Upper Body:     Right upper body: No supraclavicular adenopathy.     Left upper body: No supraclavicular adenopathy.  Skin:    General: Skin is warm and dry.     Capillary Refill: Capillary refill takes less than 2 seconds.     Nails: There  is no clubbing.  Neurological:     General: No focal deficit present.     Mental Status: She is alert and oriented to person, place, and time.     GCS: GCS eye subscore is 4. GCS verbal subscore is 5. GCS motor subscore is 6.     Sensory: Sensation is intact.     Motor: Motor function is intact.     Coordination: Coordination is intact.     Gait: Gait is intact.     Deep Tendon Reflexes: Reflexes are normal and symmetric.  Psychiatric:        Attention and Perception: Attention normal.        Mood and Affect: Mood normal.        Speech: Speech normal.        Behavior: Behavior normal. Behavior is cooperative.        Thought Content: Thought content normal.        Cognition and Memory: Cognition and memory normal.        Judgment: Judgment normal.     Results for orders placed or performed in visit on 02/12/23  CBC with Differential/Platelet  Result Value Ref Range   WBC 10.7 3.4 - 10.8 x10E3/uL   RBC 4.42 3.77 - 5.28 x10E6/uL   Hemoglobin 11.6 11.1 - 15.9 g/dL   Hematocrit 40.9 81.1 - 46.6 %   MCV 83 79 -  97 fL   MCH 26.2 (L) 26.6 - 33.0 pg   MCHC 31.7 31.5 - 35.7 g/dL   RDW 16.1 09.6 - 04.5 %   Platelets 447 150 - 450 x10E3/uL   Neutrophils 63 Not Estab. %   Lymphs 29 Not Estab. %   Monocytes 5 Not Estab. %   Eos 2 Not Estab. %   Basos 1 Not Estab. %   Neutrophils Absolute 6.7 1.4 - 7.0 x10E3/uL   Lymphocytes Absolute 3.2 (H) 0.7 - 3.1 x10E3/uL   Monocytes Absolute 0.6 0.1 - 0.9 x10E3/uL   EOS (ABSOLUTE) 0.2 0.0 - 0.4 x10E3/uL   Basophils Absolute 0.1 0.0 - 0.2 x10E3/uL   Immature Granulocytes 0 Not Estab. %   Immature Grans (Abs) 0.0 0.0 - 0.1 x10E3/uL  CMP14+EGFR  Result Value Ref Range   Glucose 84 70 - 99 mg/dL   BUN 15 6 - 24 mg/dL   Creatinine, Ser 4.09 0.57 - 1.00 mg/dL   eGFR 811 >91 YN/WGN/5.62   BUN/Creatinine Ratio 25 (H) 9 - 23   Sodium 138 134 - 144 mmol/L   Potassium 4.0 3.5 - 5.2 mmol/L   Chloride 100 96 - 106 mmol/L   CO2 24 20 - 29 mmol/L    Calcium 9.9 8.7 - 10.2 mg/dL   Total Protein 7.2 6.0 - 8.5 g/dL   Albumin 4.4 3.9 - 4.9 g/dL   Globulin, Total 2.8 1.5 - 4.5 g/dL   Bilirubin Total 0.2 0.0 - 1.2 mg/dL   Alkaline Phosphatase 53 44 - 121 IU/L   AST 14 0 - 40 IU/L   ALT 13 0 - 32 IU/L  Lipid panel  Result Value Ref Range   Cholesterol, Total 240 (H) 100 - 199 mg/dL   Triglycerides 130 0 - 149 mg/dL   HDL 60 >86 mg/dL   VLDL Cholesterol Cal 18 5 - 40 mg/dL   LDL Chol Calc (NIH) 578 (H) 0 - 99 mg/dL   Chol/HDL Ratio 4.0 0.0 - 4.4 ratio  Hemoglobin A1c  Result Value Ref Range   Hgb A1c MFr Bld 6.0 (H) 4.8 - 5.6 %   Est. average glucose Bld gHb Est-mCnc 126 mg/dL  TSH  Result Value Ref Range   TSH 1.460 0.450 - 4.500 uIU/mL  VITAMIN D 25 Hydroxy (Vit-D Deficiency, Fractures)  Result Value Ref Range   Vit D, 25-Hydroxy 27.8 (L) 30.0 - 100.0 ng/mL  HM PAP SMEAR  Result Value Ref Range   HM Pap smear negative   Hemoglobin A1c  Result Value Ref Range   Hgb A1c MFr Bld 6.2 (H) 4.8 - 5.6 %   Est. average glucose Bld gHb Est-mCnc 131 mg/dL  Results Console HPV  Result Value Ref Range   CHL HPV Negative          Assessment & Plan:   Problem List Items Addressed This Visit     Essential hypertension    Chronic. Currently managed with lisinopril. No alarm symptoms present at this time. BP stable. Labs pending.  Plan: - Continue current regimen - Monitor BP at home 1-2 days a week to ensure <130/85. - Follow-up with any changes, otherwise, 1 year.       Relevant Medications   phentermine (ADIPEX-P) 37.5 MG tablet   lisinopril (ZESTRIL) 20 MG tablet   Other Relevant Orders   CBC with Differential/Platelet (Completed)   CMP14+EGFR (Completed)   Lipid panel (Completed)   Hemoglobin A1c (Completed)   TSH (Completed)   VITAMIN D  25 Hydroxy (Vit-D Deficiency, Fractures) (Completed)   Body mass index (BMI) 40.0-44.9, adult (HCC)    Chronic. Previous management with semaglutide effective for lowering BP,  lipids, A1c, and weight. Currently unable to maintain with diet and exercise alone. We discussed options that may be helpful. Resources are limited due to cost  Plan: - Trial of phentermine. BP is stable - Continue to work on diet and exercise. Information on AVS - Follow-up with me in 3 months if you have success and would like to continue on the treatment.       Relevant Medications   phentermine (ADIPEX-P) 37.5 MG tablet   Other Relevant Orders   CBC with Differential/Platelet (Completed)   CMP14+EGFR (Completed)   Lipid panel (Completed)   Hemoglobin A1c (Completed)   TSH (Completed)   VITAMIN D 25 Hydroxy (Vit-D Deficiency, Fractures) (Completed)   Encounter for annual physical exam - Primary    CPE completed today. Review of HM activities and recommendations discussed and provided on AVS. Anticipatory guidance, diet, and exercise recommendations provided. Medications, allergies, and hx reviewed and updated as necessary. Orders placed as listed below.  Plan: - Labs ordered. Will make changes as necessary based on results.  - I will review these results and send recommendations via MyChart or a telephone call.  - F/U with CPE in 1 year or sooner for acute/chronic health needs as directed.        Relevant Medications   phentermine (ADIPEX-P) 37.5 MG tablet   Other Relevant Orders   CBC with Differential/Platelet (Completed)   CMP14+EGFR (Completed)   Lipid panel (Completed)   Hemoglobin A1c (Completed)   TSH (Completed)   VITAMIN D 25 Hydroxy (Vit-D Deficiency, Fractures) (Completed)   Dyslipidemia   Relevant Medications   phentermine (ADIPEX-P) 37.5 MG tablet   Other Relevant Orders   CBC with Differential/Platelet (Completed)   CMP14+EGFR (Completed)   Lipid panel (Completed)   Hemoglobin A1c (Completed)   TSH (Completed)   VITAMIN D 25 Hydroxy (Vit-D Deficiency, Fractures) (Completed)   Other Visit Diagnoses     Nausea and vomiting, unspecified vomiting type        Relevant Medications   ondansetron (ZOFRAN-ODT) 4 MG disintegrating tablet          Follow up plan: Return in about 1 year (around 02/12/2024) for CPE-- OK to schedule her husband if she would like.  NEXT PREVENTATIVE PHYSICAL DUE IN 1 YEAR.  PATIENT COUNSELING PROVIDED FOR ALL ADULT PATIENTS: A well balanced diet low in saturated fats, cholesterol, and moderation in carbohydrates.  This can be as simple as monitoring portion sizes and cutting back on sugary beverages such as soda and juice to start with.    Daily water consumption of at least 64 ounces.  Physical activity at least 180 minutes per week.  If just starting out, start 10 minutes a day and work your way up.   This can be as simple as taking the stairs instead of the elevator and walking 2-3 laps around the office  purposefully every day.   STD protection, partner selection, and regular testing if high risk.  Limited consumption of alcoholic beverages if alcohol is consumed. For men, I recommend no more than 14 alcoholic beverages per week, spread out throughout the week (max 2 per day). Avoid "binge" drinking or consuming large quantities of alcohol in one setting.  Please let me know if you feel you may need help with reduction or quitting alcohol consumption.   Avoidance of  nicotine, if used. Please let me know if you feel you may need help with reduction or quitting nicotine use.   Daily mental health attention. This can be in the form of 5 minute daily meditation, prayer, journaling, yoga, reflection, etc.  Purposeful attention to your emotions and mental state can significantly improve your overall wellbeing  and  Health.  Please know that I am here to help you with all of your health care goals and am happy to work with you to find a solution that works best for you.  The greatest advice I have received with any changes in life are to take it one step at a time, that even means if all you can focus on is the  next 60 seconds, then do that and celebrate your victories.  With any changes in life, you will have set backs, and that is OK. The important thing to remember is, if you have a set back, it is not a failure, it is an opportunity to try again! Screening Testing Mammogram Every 1 -2 years based on history and risk factors Starting at age 41 Pap Smear Ages 21-39 every 3 years Ages 71-65 every 5 years with HPV testing More frequent testing may be required based on results and history Colon Cancer Screening Every 1-10 years based on test performed, risk factors, and history Starting at age 80 Bone Density Screening Every 2-10 years based on history Starting at age 60 for women Recommendations for men differ based on medication usage, history, and risk factors AAA Screening One time ultrasound Men 19-66 years old who have every smoked Lung Cancer Screening Low Dose Lung CT every 12 months Age 55-80 years with a 30 pack-year smoking history who still smoke or who have quit within the last 15 years   Screening Labs Routine  Labs: Complete Blood Count (CBC), Complete Metabolic Panel (CMP), Cholesterol (Lipid Panel) Every 6-12 months based on history and medications May be recommended more frequently based on current conditions or previous results Hemoglobin A1c Lab Every 3-12 months based on history and previous results Starting at age 34 or earlier with diagnosis of diabetes, high cholesterol, BMI >26, and/or risk factors Frequent monitoring for patients with diabetes to ensure blood sugar control Thyroid Panel (TSH) Every 6 months based on history, symptoms, and risk factors May be repeated more often if on medication HIV One time testing for all patients 74 and older May be repeated more frequently for patients with increased risk factors or exposure Hepatitis C One time testing for all patients 3 and older May be repeated more frequently for patients with increased risk factors  or exposure Gonorrhea, Chlamydia Every 12 months for all sexually active persons 13-24 years Additional monitoring may be recommended for those who are considered high risk or who have symptoms Every 12 months for any woman on birth control, regardless of sexual activity PSA Men 37-13 years old with risk factors Additional screening may be recommended from age 27-69 based on risk factors, symptoms, and history  Vaccine Recommendations Tetanus Booster All adults every 10 years Flu Vaccine All patients 6 months and older every year COVID Vaccine All patients 12 years and older Initial dosing with booster May recommend additional booster based on age and health history HPV Vaccine 2 doses all patients age 71-26 Dosing may be considered for patients over 26 Shingles Vaccine (Shingrix) 2 doses all adults 55 years and older Pneumonia (Pneumovax 23) All adults 65 years and older May recommend earlier  dosing based on health history One year apart from Prevnar 13 Pneumonia (Prevnar 13) All adults 65 years and older Dosed 1 year after Pneumovax 23 Pneumonia (Prevnar 20) One time alternative to the two dosing of 13 and 23 For all adults with initial dose of 23, 20 is recommended 1 year later For all adults with initial dose of 13, 23 is still recommended as second option 1 year later

## 2023-02-12 NOTE — Patient Instructions (Signed)
WEIGHT LOSS PLANNING  For best management of weight, it is vital to balance intake versus output. This means the number of calories burned per day must be less than the calories you take in with food and drink.   I recommend trying to follow a diet with the following: Calories: 1200-1500 calories per day Carbohydrates: 150-180 grams of carbohydrates per day  Why: Gives your body enough "quick fuel" for cells to maintain normal function without sending them into starvation mode.  Protein: At least 90 grams of protein per day- 30 grams with each meal Why: Protein takes longer and uses more energy than carbohydrates to break down for fuel. The carbohydrates in your meals serves as quick energy sources and proteins help use some of that extra quick energy to break down to produce long term energy. This helps you not feel hungry as quickly and protein breakdown burns calories.  Water: Drink AT LEAST 64 ounces of water per day  Why: Water is essential to healthy metabolism. Water helps to fill the stomach and keep you fuller longer. Water is required for healthy digestion and filtering of waste in the body.  Fat: Limit fats in your diet- when choosing fats, choose foods with lower fats content such as lean meats (chicken, fish, turkey).  Why: Increased fat intake leads to storage "for later". Once you burn your carbohydrate energy, your body goes into fat and protein breakdown mode to help you loose weight.  Cholesterol: Fats and oils that are LIQUID at room temperature are best. Choose vegetable oils (olive oil, avocado oil, nuts). Avoid fats that are SOLID at room temperature (animal fats, processed meats). Healthy fats are often found in whole grains, beans, nuts, seeds, and berries.  Why: Elevated cholesterol levels lead to build up of cholesterol on the inside of your blood vessels. This will eventually cause the blood vessels to become hard and can lead to high blood pressure and damage to your  organs. When the blood flow is reduced, but the pressure is high from cholesterol buildup, parts of the cholesterol can break off and form clots that can go to the brain or heart leading to a stroke or heart attack.  Fiber: Increase amount of SOLUBLE the fiber in your diet. This helps to fill you up, lowers cholesterol, and helps with digestion. Some foods high in soluble fiber are oats, peas, beans, apples, carrots, barley, and citrus fruits.   Why: Fiber fills you up, helps remove excess cholesterol, and aids in healthy digestion which are all very important in weight management.   I recommend the following as a minimum activity routine: Purposeful walk or other physical activity at least 20 minutes every single day. This means purposefully taking a walk, jog, bike, swim, treadmill, elliptical, dance, etc.  This activity should be ABOVE your normal daily activities, such as walking at work. Goal exercise should be at least 150 minutes a week- work your way up to this.   Heart Rate: Your maximum exercise heart rate should be 220 - Your Age in Years. When exercising, get your heart rate up, but avoid going over the maximum targeted heart rate.  60-70% of your maximum heart rate is where you tend to burn the most fat. To find this number:  220 - Age In Years= Max HR  Max HR x 0.6 (or 0.7) = Fat Burning HR The Fat Burning HR is your goal heart rate while working out to burn the most fat.  NEVER exercise to   the point your feel lightheaded, weak, nauseated, dizzy. If you experience ANY of these symptoms- STOP exercise! Allow yourself to cool down and your heart rate to come down. Then restart slower next time.  If at ANY TIME you feel chest pain or chest pressure during exercise, STOP IMMEDIATELY and seek medical attention.   

## 2023-02-13 LAB — CMP14+EGFR
ALT: 13 IU/L (ref 0–32)
AST: 14 IU/L (ref 0–40)
Albumin: 4.4 g/dL (ref 3.9–4.9)
Alkaline Phosphatase: 53 IU/L (ref 44–121)
BUN/Creatinine Ratio: 25 — ABNORMAL HIGH (ref 9–23)
BUN: 15 mg/dL (ref 6–24)
Bilirubin Total: 0.2 mg/dL (ref 0.0–1.2)
CO2: 24 mmol/L (ref 20–29)
Calcium: 9.9 mg/dL (ref 8.7–10.2)
Chloride: 100 mmol/L (ref 96–106)
Creatinine, Ser: 0.6 mg/dL (ref 0.57–1.00)
Globulin, Total: 2.8 g/dL (ref 1.5–4.5)
Glucose: 84 mg/dL (ref 70–99)
Potassium: 4 mmol/L (ref 3.5–5.2)
Sodium: 138 mmol/L (ref 134–144)
Total Protein: 7.2 g/dL (ref 6.0–8.5)
eGFR: 113 mL/min/{1.73_m2} (ref >59)

## 2023-02-13 LAB — CBC WITH DIFFERENTIAL/PLATELET
Basophils Absolute: 0.1 10*3/uL (ref 0.0–0.2)
Basos: 1 %
EOS (ABSOLUTE): 0.2 10*3/uL (ref 0.0–0.4)
Eos: 2 %
Hematocrit: 36.6 % (ref 34.0–46.6)
Hemoglobin: 11.6 g/dL (ref 11.1–15.9)
Immature Grans (Abs): 0 10*3/uL (ref 0.0–0.1)
Immature Granulocytes: 0 %
Lymphocytes Absolute: 3.2 10*3/uL — ABNORMAL HIGH (ref 0.7–3.1)
Lymphs: 29 %
MCH: 26.2 pg — ABNORMAL LOW (ref 26.6–33.0)
MCHC: 31.7 g/dL (ref 31.5–35.7)
MCV: 83 fL (ref 79–97)
Monocytes Absolute: 0.6 10*3/uL (ref 0.1–0.9)
Monocytes: 5 %
Neutrophils Absolute: 6.7 10*3/uL (ref 1.4–7.0)
Neutrophils: 63 %
Platelets: 447 10*3/uL (ref 150–450)
RBC: 4.42 x10E6/uL (ref 3.77–5.28)
RDW: 14.2 % (ref 11.7–15.4)
WBC: 10.7 10*3/uL (ref 3.4–10.8)

## 2023-02-13 LAB — VITAMIN D 25 HYDROXY (VIT D DEFICIENCY, FRACTURES): Vit D, 25-Hydroxy: 27.8 ng/mL — ABNORMAL LOW (ref 30.0–100.0)

## 2023-02-13 LAB — HEMOGLOBIN A1C
Est. average glucose Bld gHb Est-mCnc: 131 mg/dL
Est. average glucose Bld gHb Est-mCnc: 126 mg/dL
Hgb A1c MFr Bld: 6.2 % — ABNORMAL HIGH (ref 4.8–5.6)
Hgb A1c MFr Bld: 6 % — ABNORMAL HIGH (ref 4.8–5.6)

## 2023-02-13 LAB — TSH: TSH: 1.46 u[IU]/mL (ref 0.450–4.500)

## 2023-02-13 LAB — LIPID PANEL
Chol/HDL Ratio: 4 ratio (ref 0.0–4.4)
Cholesterol, Total: 240 mg/dL — ABNORMAL HIGH (ref 100–199)
HDL: 60 mg/dL (ref >39)
LDL Chol Calc (NIH): 162 mg/dL — ABNORMAL HIGH (ref 0–99)
Triglycerides: 102 mg/dL (ref 0–149)
VLDL Cholesterol Cal: 18 mg/dL (ref 5–40)

## 2023-02-22 ENCOUNTER — Encounter: Payer: Self-pay | Admitting: Nurse Practitioner

## 2023-02-25 ENCOUNTER — Other Ambulatory Visit: Payer: Self-pay | Admitting: Nurse Practitioner

## 2023-02-25 ENCOUNTER — Other Ambulatory Visit (HOSPITAL_BASED_OUTPATIENT_CLINIC_OR_DEPARTMENT_OTHER): Payer: Self-pay

## 2023-02-25 ENCOUNTER — Other Ambulatory Visit: Payer: Self-pay

## 2023-02-25 DIAGNOSIS — E559 Vitamin D deficiency, unspecified: Secondary | ICD-10-CM | POA: Insufficient documentation

## 2023-02-25 DIAGNOSIS — E785 Hyperlipidemia, unspecified: Secondary | ICD-10-CM

## 2023-02-25 DIAGNOSIS — I10 Essential (primary) hypertension: Secondary | ICD-10-CM

## 2023-02-25 DIAGNOSIS — R7303 Prediabetes: Secondary | ICD-10-CM | POA: Insufficient documentation

## 2023-02-25 MED ORDER — VITAMIN D (ERGOCALCIFEROL) 1.25 MG (50000 UNIT) PO CAPS
50000.0000 [IU] | ORAL_CAPSULE | ORAL | 0 refills | Status: DC
  Filled 2023-02-25: qty 12, 84d supply, fill #0

## 2023-02-25 NOTE — Assessment & Plan Note (Signed)
Chronic. Previous management with semaglutide effective for lowering BP, lipids, A1c, and weight. Currently unable to maintain with diet and exercise alone. We discussed options that may be helpful. Resources are limited due to cost  Plan: - Trial of phentermine. BP is stable - Continue to work on diet and exercise. Information on AVS - Follow-up with me in 3 months if you have success and would like to continue on the treatment.

## 2023-02-25 NOTE — Assessment & Plan Note (Signed)
Chronic. Currently managed with lisinopril. No alarm symptoms present at this time. BP stable. Labs pending.  Plan: - Continue current regimen - Monitor BP at home 1-2 days a week to ensure <130/85. - Follow-up with any changes, otherwise, 1 year.

## 2023-02-25 NOTE — Assessment & Plan Note (Signed)

## 2023-03-10 ENCOUNTER — Other Ambulatory Visit (HOSPITAL_BASED_OUTPATIENT_CLINIC_OR_DEPARTMENT_OTHER): Payer: Self-pay | Admitting: Nurse Practitioner

## 2023-03-10 ENCOUNTER — Other Ambulatory Visit (HOSPITAL_BASED_OUTPATIENT_CLINIC_OR_DEPARTMENT_OTHER): Payer: Self-pay

## 2023-03-10 DIAGNOSIS — Z6841 Body Mass Index (BMI) 40.0 and over, adult: Secondary | ICD-10-CM

## 2023-03-10 DIAGNOSIS — F321 Major depressive disorder, single episode, moderate: Secondary | ICD-10-CM

## 2023-03-10 NOTE — Telephone Encounter (Signed)
Last apt 02/12/23 last filled 12/25/22 not due until 03/27/23

## 2023-03-12 ENCOUNTER — Other Ambulatory Visit (HOSPITAL_BASED_OUTPATIENT_CLINIC_OR_DEPARTMENT_OTHER): Payer: Self-pay

## 2023-03-12 ENCOUNTER — Other Ambulatory Visit: Payer: Self-pay | Admitting: Nurse Practitioner

## 2023-03-12 MED ORDER — BUPROPION HCL ER (XL) 150 MG PO TB24
150.0000 mg | ORAL_TABLET | Freq: Every day | ORAL | 3 refills | Status: DC
  Filled 2023-03-12: qty 90, 90d supply, fill #0
  Filled 2023-06-09: qty 90, 90d supply, fill #1
  Filled 2023-09-17: qty 90, 90d supply, fill #2
  Filled 2023-12-20: qty 90, 90d supply, fill #3

## 2023-03-17 ENCOUNTER — Telehealth: Payer: 59 | Admitting: Physician Assistant

## 2023-03-17 DIAGNOSIS — T7840XA Allergy, unspecified, initial encounter: Secondary | ICD-10-CM

## 2023-03-18 ENCOUNTER — Other Ambulatory Visit (HOSPITAL_BASED_OUTPATIENT_CLINIC_OR_DEPARTMENT_OTHER): Payer: Self-pay

## 2023-03-18 MED ORDER — PREDNISONE 10 MG (21) PO TBPK
ORAL_TABLET | ORAL | 0 refills | Status: DC
  Filled 2023-03-18: qty 21, 6d supply, fill #0

## 2023-03-18 MED ORDER — PREDNISONE 10 MG PO TABS
ORAL_TABLET | ORAL | 0 refills | Status: DC
  Filled 2023-03-18: qty 21, 6d supply, fill #0

## 2023-03-18 NOTE — Progress Notes (Signed)
E Visit for Rash  We are sorry that you are not feeling well. Here is how we plan to help!  Based on what you have shared with me you have had an allergic response to an unknown trigger. Keep the skin clean and dry. Ok to continue use of Benadryl and Famotidine. I have added on a steroid pack to take as directed to further calm this down/resolve things. If not quickly improving or anything worsening despite treatment, you need to seek an in-person evaluation ASAP.  HOME CARE:  Take cool showers and avoid direct sunlight. Apply cool compress or wet dressings. Take a bath in an oatmeal bath.  Sprinkle content of one Aveeno packet under running faucet with comfortably warm water.  Bathe for 15-20 minutes, 1-2 times daily.  Pat dry with a towel. Do not rub the rash. Use hydrocortisone cream. Take an antihistamine like Benadryl for widespread rashes that itch.  The adult dose of Benadryl is 25-50 mg by mouth 4 times daily. Caution:  This type of medication may cause sleepiness.  Do not drink alcohol, drive, or operate dangerous machinery while taking antihistamines.  Do not take these medications if you have prostate enlargement.  Read package instructions thoroughly on all medications that you take.  GET HELP RIGHT AWAY IF:  Symptoms don't go away after treatment. Severe itching that persists. If you rash spreads or swells. If you rash begins to smell. If it blisters and opens or develops a yellow-brown crust. You develop a fever. You have a sore throat. You become short of breath.  MAKE SURE YOU:  Understand these instructions. Will watch your condition. Will get help right away if you are not doing well or get worse.  Thank you for choosing an e-visit.  Your e-visit answers were reviewed by a board certified advanced clinical practitioner to complete your personal care plan. Depending upon the condition, your plan could have included both over the counter or prescription  medications.  Please review your pharmacy choice. Make sure the pharmacy is open so you can pick up prescription now. If there is a problem, you may contact your provider through [employer] and have the prescription routed to another pharmacy.  Your safety is important to [place]. If you have drug allergies check your prescription carefully.   For the next 24 hours you can use MyChart to ask questions about today's visit, request a non-urgent call back, or ask for a work or school excuse. You will get an email in the next two days asking about your experience. I hope that your e-visit has been valuable and will speed your recovery.

## 2023-03-18 NOTE — Progress Notes (Signed)
I have spent 5 minutes in review of e-visit questionnaire, review and updating patient chart, medical decision making and response to patient.   Mia Milan Cody Jacklynn Dehaas, PA-C    

## 2023-03-19 ENCOUNTER — Other Ambulatory Visit (HOSPITAL_COMMUNITY): Payer: Self-pay

## 2023-03-22 DIAGNOSIS — R21 Rash and other nonspecific skin eruption: Secondary | ICD-10-CM | POA: Diagnosis not present

## 2023-04-01 ENCOUNTER — Other Ambulatory Visit (HOSPITAL_BASED_OUTPATIENT_CLINIC_OR_DEPARTMENT_OTHER): Payer: Self-pay

## 2023-04-01 MED ORDER — INFLUENZA VIRUS VACC SPLIT PF (FLUZONE) 0.5 ML IM SUSY
0.5000 mL | PREFILLED_SYRINGE | Freq: Once | INTRAMUSCULAR | 0 refills | Status: AC
  Filled 2023-04-01: qty 0.5, 1d supply, fill #0

## 2023-05-02 ENCOUNTER — Other Ambulatory Visit: Payer: Self-pay | Admitting: Nurse Practitioner

## 2023-05-02 DIAGNOSIS — Z0000 Encounter for general adult medical examination without abnormal findings: Secondary | ICD-10-CM

## 2023-05-02 DIAGNOSIS — Z6841 Body Mass Index (BMI) 40.0 and over, adult: Secondary | ICD-10-CM

## 2023-05-02 DIAGNOSIS — I10 Essential (primary) hypertension: Secondary | ICD-10-CM

## 2023-05-02 DIAGNOSIS — E785 Hyperlipidemia, unspecified: Secondary | ICD-10-CM

## 2023-05-04 MED ORDER — PHENTERMINE HCL 37.5 MG PO TABS
37.5000 mg | ORAL_TABLET | Freq: Every day | ORAL | 0 refills | Status: DC
  Filled 2023-05-04 – 2023-05-12 (×2): qty 90, 90d supply, fill #0

## 2023-05-04 NOTE — Telephone Encounter (Signed)
Last apt 02/12/23

## 2023-05-05 ENCOUNTER — Other Ambulatory Visit (HOSPITAL_BASED_OUTPATIENT_CLINIC_OR_DEPARTMENT_OTHER): Payer: Self-pay

## 2023-05-05 ENCOUNTER — Other Ambulatory Visit: Payer: Self-pay

## 2023-05-06 ENCOUNTER — Other Ambulatory Visit (HOSPITAL_BASED_OUTPATIENT_CLINIC_OR_DEPARTMENT_OTHER): Payer: Self-pay

## 2023-05-07 ENCOUNTER — Encounter: Payer: Self-pay | Admitting: Nurse Practitioner

## 2023-05-08 DIAGNOSIS — C07 Malignant neoplasm of parotid gland: Secondary | ICD-10-CM | POA: Diagnosis not present

## 2023-05-12 ENCOUNTER — Other Ambulatory Visit: Payer: Self-pay

## 2023-05-12 ENCOUNTER — Other Ambulatory Visit (HOSPITAL_BASED_OUTPATIENT_CLINIC_OR_DEPARTMENT_OTHER): Payer: Self-pay

## 2023-06-09 ENCOUNTER — Other Ambulatory Visit (HOSPITAL_BASED_OUTPATIENT_CLINIC_OR_DEPARTMENT_OTHER): Payer: Self-pay

## 2023-06-15 ENCOUNTER — Ambulatory Visit: Payer: 59 | Admitting: Nurse Practitioner

## 2023-06-16 DIAGNOSIS — H5213 Myopia, bilateral: Secondary | ICD-10-CM | POA: Diagnosis not present

## 2023-06-29 ENCOUNTER — Encounter (HOSPITAL_BASED_OUTPATIENT_CLINIC_OR_DEPARTMENT_OTHER): Payer: Self-pay | Admitting: Radiology

## 2023-06-29 ENCOUNTER — Ambulatory Visit (HOSPITAL_BASED_OUTPATIENT_CLINIC_OR_DEPARTMENT_OTHER)
Admission: RE | Admit: 2023-06-29 | Discharge: 2023-06-29 | Disposition: A | Discharge status: Home / Self Care | Payer: 59 | Source: Ambulatory Visit | Attending: Nurse Practitioner | Admitting: Nurse Practitioner

## 2023-06-29 DIAGNOSIS — Z1231 Encounter for screening mammogram for malignant neoplasm of breast: Secondary | ICD-10-CM | POA: Insufficient documentation

## 2023-07-03 ENCOUNTER — Encounter: Payer: Self-pay | Admitting: Nurse Practitioner

## 2023-07-06 ENCOUNTER — Other Ambulatory Visit: Payer: Self-pay | Admitting: Family Medicine

## 2023-07-06 ENCOUNTER — Other Ambulatory Visit: Payer: Self-pay | Admitting: Nurse Practitioner

## 2023-07-06 DIAGNOSIS — R928 Other abnormal and inconclusive findings on diagnostic imaging of breast: Secondary | ICD-10-CM

## 2023-07-08 ENCOUNTER — Other Ambulatory Visit: Payer: Self-pay | Admitting: Nurse Practitioner

## 2023-07-08 DIAGNOSIS — R928 Other abnormal and inconclusive findings on diagnostic imaging of breast: Secondary | ICD-10-CM

## 2023-07-09 ENCOUNTER — Ambulatory Visit
Admission: RE | Admit: 2023-07-09 | Discharge: 2023-07-09 | Disposition: A | Discharge status: Home / Self Care | Payer: 59 | Source: Ambulatory Visit | Attending: Nurse Practitioner | Admitting: Nurse Practitioner

## 2023-07-09 ENCOUNTER — Ambulatory Visit
Admission: RE | Admit: 2023-07-09 | Discharge: 2023-07-09 | Disposition: A | Discharge status: Home / Self Care | Payer: 59 | Source: Ambulatory Visit | Attending: Family Medicine | Admitting: Family Medicine

## 2023-07-09 DIAGNOSIS — R928 Other abnormal and inconclusive findings on diagnostic imaging of breast: Secondary | ICD-10-CM

## 2023-07-09 DIAGNOSIS — R92322 Mammographic fibroglandular density, left breast: Secondary | ICD-10-CM | POA: Diagnosis not present

## 2023-07-11 ENCOUNTER — Other Ambulatory Visit: Payer: 59

## 2023-08-06 ENCOUNTER — Encounter (HOSPITAL_BASED_OUTPATIENT_CLINIC_OR_DEPARTMENT_OTHER): Payer: Self-pay | Admitting: Family Medicine

## 2023-08-06 ENCOUNTER — Ambulatory Visit (HOSPITAL_BASED_OUTPATIENT_CLINIC_OR_DEPARTMENT_OTHER): Payer: 59 | Admitting: Family Medicine

## 2023-08-06 ENCOUNTER — Other Ambulatory Visit (HOSPITAL_BASED_OUTPATIENT_CLINIC_OR_DEPARTMENT_OTHER): Payer: Self-pay

## 2023-08-06 VITALS — BP 121/84 | HR 72 | Ht 66.25 in | Wt 253.8 lb

## 2023-08-06 DIAGNOSIS — I10 Essential (primary) hypertension: Secondary | ICD-10-CM | POA: Diagnosis not present

## 2023-08-06 DIAGNOSIS — E559 Vitamin D deficiency, unspecified: Secondary | ICD-10-CM

## 2023-08-06 DIAGNOSIS — T300 Burn of unspecified body region, unspecified degree: Secondary | ICD-10-CM | POA: Diagnosis not present

## 2023-08-06 DIAGNOSIS — L814 Other melanin hyperpigmentation: Secondary | ICD-10-CM | POA: Diagnosis not present

## 2023-08-06 DIAGNOSIS — Z1211 Encounter for screening for malignant neoplasm of colon: Secondary | ICD-10-CM

## 2023-08-06 DIAGNOSIS — R7303 Prediabetes: Secondary | ICD-10-CM

## 2023-08-06 DIAGNOSIS — D1801 Hemangioma of skin and subcutaneous tissue: Secondary | ICD-10-CM | POA: Diagnosis not present

## 2023-08-06 DIAGNOSIS — Z86018 Personal history of other benign neoplasm: Secondary | ICD-10-CM | POA: Diagnosis not present

## 2023-08-06 DIAGNOSIS — L821 Other seborrheic keratosis: Secondary | ICD-10-CM | POA: Diagnosis not present

## 2023-08-06 DIAGNOSIS — Z6841 Body Mass Index (BMI) 40.0 and over, adult: Secondary | ICD-10-CM | POA: Diagnosis not present

## 2023-08-06 DIAGNOSIS — D229 Melanocytic nevi, unspecified: Secondary | ICD-10-CM | POA: Diagnosis not present

## 2023-08-06 DIAGNOSIS — E785 Hyperlipidemia, unspecified: Secondary | ICD-10-CM

## 2023-08-06 DIAGNOSIS — L578 Other skin changes due to chronic exposure to nonionizing radiation: Secondary | ICD-10-CM | POA: Diagnosis not present

## 2023-08-06 MED ORDER — LIDOCAINE 5 % EX OINT
1.0000 | TOPICAL_OINTMENT | CUTANEOUS | 0 refills | Status: DC | PRN
  Filled 2023-08-06: qty 35.44, 30d supply, fill #0

## 2023-08-06 MED ORDER — PHENTERMINE HCL 37.5 MG PO TABS
37.5000 mg | ORAL_TABLET | Freq: Every day | ORAL | 2 refills | Status: DC
  Filled 2023-08-06: qty 90, 90d supply, fill #0
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
## 2023-08-07 ENCOUNTER — Encounter (HOSPITAL_BASED_OUTPATIENT_CLINIC_OR_DEPARTMENT_OTHER): Payer: Self-pay | Admitting: Family Medicine

## 2023-08-07 LAB — COMPREHENSIVE METABOLIC PANEL
ALT: 32 [IU]/L (ref 0–32)
AST: 24 [IU]/L (ref 0–40)
Albumin: 4.3 g/dL (ref 3.9–4.9)
Alkaline Phosphatase: 64 [IU]/L (ref 44–121)
BUN/Creatinine Ratio: 20 (ref 9–23)
BUN: 14 mg/dL (ref 6–24)
Bilirubin Total: 0.4 mg/dL (ref 0.0–1.2)
CO2: 23 mmol/L (ref 20–29)
Calcium: 9.3 mg/dL (ref 8.7–10.2)
Chloride: 102 mmol/L (ref 96–106)
Creatinine, Ser: 0.7 mg/dL (ref 0.57–1.00)
Globulin, Total: 2.7 g/dL (ref 1.5–4.5)
Glucose: 97 mg/dL (ref 70–99)
Potassium: 4.8 mmol/L (ref 3.5–5.2)
Sodium: 141 mmol/L (ref 134–144)
Total Protein: 7 g/dL (ref 6.0–8.5)
eGFR: 109 mL/min/{1.73_m2} (ref >59)

## 2023-08-07 LAB — VITAMIN D 25 HYDROXY (VIT D DEFICIENCY, FRACTURES): Vit D, 25-Hydroxy: 30.8 ng/mL (ref 30.0–100.0)

## 2023-08-07 LAB — LIPID PANEL
Chol/HDL Ratio: 5 {ratio} — ABNORMAL HIGH (ref 0.0–4.4)
Cholesterol, Total: 210 mg/dL — ABNORMAL HIGH (ref 100–199)
HDL: 42 mg/dL (ref >39)
LDL Chol Calc (NIH): 144 mg/dL — ABNORMAL HIGH (ref 0–99)
Triglycerides: 133 mg/dL (ref 0–149)
VLDL Cholesterol Cal: 24 mg/dL (ref 5–40)

## 2023-08-07 LAB — HEMOGLOBIN A1C
Est. average glucose Bld gHb Est-mCnc: 117 mg/dL
Hgb A1c MFr Bld: 5.7 % — ABNORMAL HIGH (ref 4.8–5.6)

## 2023-08-07 NOTE — Progress Notes (Signed)
 Hi [name] [name], Your lab work is looking good.  You have reduced your cholesterol by about 40 points and your total cholesterol values.  Your LDL has also decreased from 162 to 144.  You have also reduced your A1c from 6.0 down to 5.7.  Keep up the great work.  Your CMP is normal and your vitamin D  has come up to a normal value as well.  No changes needed at this time, keep up the good work with diet and exercise and let me know if you need anything.

## 2023-08-13 ENCOUNTER — Encounter: Payer: 59 | Admitting: Nurse Practitioner

## 2023-08-13 ENCOUNTER — Ambulatory Visit (HOSPITAL_BASED_OUTPATIENT_CLINIC_OR_DEPARTMENT_OTHER): Payer: 59 | Admitting: Family Medicine

## 2023-08-19 ENCOUNTER — Encounter (HOSPITAL_BASED_OUTPATIENT_CLINIC_OR_DEPARTMENT_OTHER): Payer: Self-pay | Admitting: Family Medicine

## 2023-08-20 ENCOUNTER — Other Ambulatory Visit (HOSPITAL_BASED_OUTPATIENT_CLINIC_OR_DEPARTMENT_OTHER): Payer: Self-pay

## 2023-08-20 DIAGNOSIS — N951 Menopausal and female climacteric states: Secondary | ICD-10-CM | POA: Diagnosis not present

## 2023-08-20 DIAGNOSIS — Z01419 Encounter for gynecological examination (general) (routine) without abnormal findings: Secondary | ICD-10-CM | POA: Diagnosis not present

## 2023-08-20 MED ORDER — PROGESTERONE 200 MG PO CAPS
400.0000 mg | ORAL_CAPSULE | Freq: Every evening | ORAL | 0 refills | Status: DC
  Filled 2023-08-20: qty 20, 10d supply, fill #0

## 2023-08-20 MED ORDER — VALACYCLOVIR HCL 1 G PO TABS
1000.0000 mg | ORAL_TABLET | Freq: Three times a day (TID) | ORAL | 0 refills | Status: AC
Start: 2023-08-20 — End: 2023-09-03
  Filled 2023-08-20: qty 40, 14d supply, fill #0

## 2023-08-25 ENCOUNTER — Other Ambulatory Visit (HOSPITAL_BASED_OUTPATIENT_CLINIC_OR_DEPARTMENT_OTHER): Payer: Self-pay

## 2023-09-22 ENCOUNTER — Telehealth: Payer: Self-pay | Admitting: *Deleted

## 2023-09-22 ENCOUNTER — Other Ambulatory Visit (HOSPITAL_BASED_OUTPATIENT_CLINIC_OR_DEPARTMENT_OTHER): Payer: Self-pay

## 2023-09-22 ENCOUNTER — Ambulatory Visit (AMBULATORY_SURGERY_CENTER): Admitting: *Deleted

## 2023-09-22 VITALS — Ht 66.0 in | Wt 250.0 lb

## 2023-09-22 DIAGNOSIS — Z1211 Encounter for screening for malignant neoplasm of colon: Secondary | ICD-10-CM

## 2023-09-22 MED ORDER — ONDANSETRON HCL 4 MG PO TABS
4.0000 mg | ORAL_TABLET | Freq: Three times a day (TID) | ORAL | 0 refills | Status: DC | PRN
  Filled 2023-09-22: qty 2, 1d supply, fill #0

## 2023-09-22 MED ORDER — NA SULFATE-K SULFATE-MG SULF 17.5-3.13-1.6 GM/177ML PO SOLN
1.0000 | Freq: Once | ORAL | 0 refills | Status: AC
  Filled 2023-09-22: qty 354, 1d supply, fill #0

## 2023-09-22 NOTE — Progress Notes (Signed)
 Pt's name and DOB verified at the beginning of the pre-visit wit 2 identifiers  Permission given to speak with  Pt denies any difficulty with ambulating,sitting, laying down or rolling side to side  Pt has no issues with ambulation   Pt has no issues moving head neck or swallowing  No egg or soy allergy known to patient   No issues known to pt with past sedation with any surgeries or procedures  Pt denies having issues being intubated  No FH of Malignant Hyperthermia  Pt is not on diet pills or shots  Pt is not on home 02   Pt is not on blood thinners   Pt denies issues with constipation   Pt is not on dialysis  Pt denise any abnormal heart rhythms   Pt denies any upcoming cardiac testing  Patient's chart reviewed by Cathlyn Parsons CNRA prior to pre-visit and patient appropriate for the [facility].  Pre-visit completed and red dot placed by patient's name on their procedure day (on provider's schedule).    Chart not reviewed by CRNA prior to [facility]  Visit by phone  Pt states weight is 250 lb  IInstructions reviewed. Pt given [facility], [facility] main # and MD on call # prior to instructions.  Pt states understanding of instructions. Instructed to review again prior to procedure. Pt states they will.   Informed pt that they will receive a text or  call from [facility] regarding there prep medleft bundle branch block

## 2023-09-22 NOTE — Telephone Encounter (Signed)
 Attempt to reach pt for pre-visit. LM with call back # Will attempt to reach again in 5 min due to no other # listed in profile    Second attempt  reached  pt

## 2023-10-05 ENCOUNTER — Telehealth: Payer: Self-pay | Admitting: *Deleted

## 2023-10-05 ENCOUNTER — Ambulatory Visit (HOSPITAL_BASED_OUTPATIENT_CLINIC_OR_DEPARTMENT_OTHER): Admitting: Student

## 2023-10-05 ENCOUNTER — Encounter (HOSPITAL_BASED_OUTPATIENT_CLINIC_OR_DEPARTMENT_OTHER): Payer: Self-pay | Admitting: Student

## 2023-10-05 ENCOUNTER — Other Ambulatory Visit (HOSPITAL_BASED_OUTPATIENT_CLINIC_OR_DEPARTMENT_OTHER): Payer: Self-pay

## 2023-10-05 DIAGNOSIS — M545 Low back pain, unspecified: Secondary | ICD-10-CM

## 2023-10-05 MED ORDER — METHYLPREDNISOLONE 4 MG PO TBPK
ORAL_TABLET | ORAL | 0 refills | Status: DC
  Filled 2023-10-05: qty 21, 6d supply, fill #0

## 2023-10-05 NOTE — Progress Notes (Signed)
 Chief Complaint: Low back pain     History of Present Illness:    [name] [name] is a 48 y.o. female presenting today for evaluation of low back pain.  Patient reports that this began about 6 days ago without any known injury.  Pain is located bilaterally and does not radiate into the lower extremities.  Denies any numbness or tingling.  She has a history of an L5 microdiscectomy back in 2014.  She did last have a lumbar MRI completed in 2016 which showed an L4-L5 disc bulge as well as small disc protrusions at L3-L4 and L5-S1.  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 x 5 yr.   PONV (postoperative nausea and vomiting)    Past Surgical History:  Procedure Laterality Date   CESAREAN SECTION     LIPOMA EXCISION Left 05/31/2018   Procedure: EXCISION OF LEFT HIP LIPOMA;  Surgeon: Gaynelle Adu, MD;  Location: [facility];  Service: General;  Laterality: Left;   MICRODISCECTOMY LUMBAR  06/2012   L5   PAROTIDECTOMY Left 05/09/2013   Procedure: LEFT SUPERFICIAL PAROTIDECTOMY;  Surgeon: Flo Shanks, MD;  Location: [facility];  Service: ENT;  Laterality: Left;   PAROTIDECTOMY Left 05/16/2013   Procedure: COMPLETION PAROTIDECTOMY LEFT WITH FACIAL  NERVE PRESERVATION ;  Surgeon: Flo Shanks, MD;  Location: [facility];  Service: ENT;  Laterality: Left;   SPINE SURGERY     TUBAL LIGATION     URETHRAL SLING  2016   Social History   Socioeconomic History   Marital status: Married    Spouse name: Not on file   Number of children: Not on file   Years of education: Not on file   Highest education level: Not on file  Occupational History   Not on file  Tobacco Use   Smoking status: Former    Types: Cigarettes   Smokeless tobacco: Never   Tobacco comments:    quit smoking 2007  Vaping Use   Vaping status: Never Used  Substance and Sexual Activity   Alcohol use: Yes    Comment: rarely   Drug use: No   Sexual activity: Yes    Birth control/protection: Surgical, None    Comment: BTL  Other Topics Concern   Not on file  Social History Narrative   Not on file   Social Drivers of Health   Financial Resource Strain: Low Risk  (08/06/2023)   Overall Financial Resource Strain (CARDIA)    Difficulty of Paying Living Expenses: Not hard at all  Food Insecurity: No Food Insecurity (08/06/2023)   Hunger Vital Sign  Worried About Programme researcher, broadcasting/film/video in the Last Year: Never true    Ran Out of Food in the Last Year: Never true  Transportation Needs: No Transportation Needs (08/06/2023)   PRAPARE - Administrator, Civil Service (Medical): No    Lack of Transportation (Non-Medical): No  Physical Activity: Sufficiently Active (08/06/2023)   Exercise Vital Sign    Days of Exercise per Week: 4 days    Minutes of Exercise per Session: 60 min  Stress: No Stress Concern Present (08/06/2023)   [employer] of Occupational Health - Occupational Stress Questionnaire    Feeling of Stress : Not at all  Social Connections: Socially Integrated (08/06/2023)   Social Connection and Isolation Panel [NHANES]    Frequency of Communication with Friends and Family: More than three times a week    Frequency of Social Gatherings with Friends and Family: Twice a week    Attends Religious Services: More than  4 times per year    Active Member of [employer] or Organizations: Yes    Attends Engineer, structural: More than 4 times per year    Marital Status: Married   Family History  Problem Relation Age of Onset   Breast cancer Mother        dx early 11s   Uterine cancer Mother        dx mid-late 20s   Diabetes Mother    Pancreatic cancer Mother 38   Prostate cancer Paternal Grandfather        dx late 16s   Breast cancer Other        MGM's sister   Colon cancer Neg Hx    Colon polyps Neg Hx    Esophageal cancer Neg Hx    Stomach cancer Neg Hx    Rectal cancer Neg Hx    Allergies  Allergen Reactions   Keflex [Cephalexin] Rash   Sulfa Antibiotics Rash   Current Outpatient Medications  Medication Sig Dispense Refill   methylPREDNISolone (MEDROL DOSEPAK) 4 MG TBPK tablet Take per packet instructions 21 each 0   buPROPion (WELLBUTRIN XL) 150 MG 24 hr tablet Take 1 tablet by mouth daily. 90 tablet 3   cholecalciferol (VITAMIN D) 1000 UNITS tablet Take 2,000 Units by mouth daily.     ibuprofen (ADVIL,MOTRIN) 200 MG tablet Take 600 mg by mouth every 8 (eight) hours as needed (for pain.).     lisinopril (ZESTRIL) 20 MG tablet Take 1 tablet (20 mg total) by mouth daily. 90 tablet 3   ondansetron (ZOFRAN) 4 MG tablet Take 1 tablet (4 mg total) by mouth every 8 (eight) hours as needed for nausea or vomiting. 2 tablet 0   ondansetron (ZOFRAN-ODT) 4 MG disintegrating tablet Take 1 tablet (4 mg total) by mouth every 8 (eight) hours as needed for nausea or vomiting. 20 tablet 5   phentermine (ADIPEX-P) 37.5 MG tablet Take 1 tablet (37.5 mg total) by mouth daily before breakfast. 30 tablet 2   progesterone (PROMETRIUM) 200 MG capsule Take 2 capsules (400 mg total) by mouth at bedtime for 10 days. 20 capsule 0   No current facility-administered medications for this visit.   No results found.  Review of Systems:   A ROS was performed including pertinent positives and negatives as documented in  the HPI.  Physical Exam :   Constitutional: NAD and appears stated age Neurological: Alert and oriented Psych: Appropriate affect and cooperative There were no vitals taken for this visit.  Comprehensive Musculoskeletal Exam:    Tenderness with palpation in bilateral lumbar paraspinal musculature without midline tenderness.  Full passive hip ROM bilaterally with flexion, internal rotation, and external rotation.  Knee flexion/extension and ankle dorsiflexion/plantarflexion strength is 5/5.  Imaging:    Assessment:   48 y.o. female with an acute on chronic episode of bilateral low back pain.  She has a history of an L5 microdiscectomy and does experience occasional flareups of her low back pain.  No red flag or sciatic symptoms present today.  She has responded well in the past to oral steroids and has a current contraindication to NSAIDs with an upcoming colonoscopy.  I will plan to start her on a Medrol Dosepak today and should she continue to have any symptoms we can follow-up as needed.  Plan :    -Start Medrol Dosepak -Return to clinic as needed     I personally saw and evaluated the patient, and participated in the management and treatment plan.  Hazle Nordmann, PA-C Orthopedics

## 2023-10-05 NOTE — Telephone Encounter (Signed)
 Returned patient call and verified steroids for back pain were fine to continue prior to colonoscopy.

## 2023-10-05 NOTE — Telephone Encounter (Signed)
 Patient needing f/u call to discuss taking steroids this week. Please advise.

## 2023-10-06 ENCOUNTER — Encounter: Payer: Self-pay | Admitting: Gastroenterology

## 2023-10-08 ENCOUNTER — Ambulatory Visit: Admitting: Gastroenterology

## 2023-10-08 ENCOUNTER — Encounter: Payer: Self-pay | Admitting: Gastroenterology

## 2023-10-08 VITALS — BP 103/61 | HR 68 | Temp 98.2°F | Resp 11 | Ht 66.0 in | Wt 250.0 lb

## 2023-10-08 DIAGNOSIS — F419 Anxiety disorder, unspecified: Secondary | ICD-10-CM | POA: Diagnosis not present

## 2023-10-08 DIAGNOSIS — K641 Second degree hemorrhoids: Secondary | ICD-10-CM

## 2023-10-08 DIAGNOSIS — K644 Residual hemorrhoidal skin tags: Secondary | ICD-10-CM

## 2023-10-08 DIAGNOSIS — F32A Depression, unspecified: Secondary | ICD-10-CM | POA: Diagnosis not present

## 2023-10-08 DIAGNOSIS — K5730 Diverticulosis of large intestine without perforation or abscess without bleeding: Secondary | ICD-10-CM

## 2023-10-08 DIAGNOSIS — I10 Essential (primary) hypertension: Secondary | ICD-10-CM | POA: Diagnosis not present

## 2023-10-08 DIAGNOSIS — Z1211 Encounter for screening for malignant neoplasm of colon: Secondary | ICD-10-CM

## 2023-10-08 DIAGNOSIS — E785 Hyperlipidemia, unspecified: Secondary | ICD-10-CM | POA: Diagnosis not present

## 2023-10-08 MED ORDER — SODIUM CHLORIDE 0.9 % IV SOLN: 500.0000 mL | Freq: Once | INTRAVENOUS | Status: DC

## 2023-10-08 NOTE — Op Note (Signed)
 [facility] Patient Name: [name] [name] Procedure Date: 10/08/2023 8:22 AM MRN: [id number] Endoscopist: Doristine Locks , MD, [id number] Age: 48 Referring MD:  Date of Birth: 1975/07/19 Gender: Female Account #: [id number] Procedure:                Colonoscopy Indications:              Screening for colorectal malignant neoplasm, This                            is the patient's first colonoscopy Medicines:                Monitored Anesthesia Care Procedure:                Pre-Anesthesia Assessment:                           - Prior to the procedure, a History and Physical                            was performed, and patient medications and                            allergies were reviewed. The patient's tolerance of                            previous anesthesia was also reviewed. The risks                            and benefits of the procedure and the sedation                            options and risks were discussed with the patient.                            All questions were answered, and informed consent                            was obtained. Prior Anticoagulants: The patient has                            taken no anticoagulant or antiplatelet agents. ASA                            Grade Assessment: III - A patient with severe                            systemic disease. After reviewing the risks and                            benefits, the patient was deemed in satisfactory                            condition to undergo the procedure.  After obtaining informed consent, the colonoscope                            was passed under direct vision. Throughout the                            procedure, the patient's blood pressure, pulse, and                            oxygen saturations were monitored continuously. The                            Olympus CF-HQ190L ([id number]) Colonoscope was                            introduced through the  anus and advanced to the the                            cecum, identified by appendiceal orifice and                            ileocecal valve. The colonoscopy was performed                            without difficulty. The patient tolerated the                            procedure well. The quality of the bowel                            preparation was good. The ileocecal valve,                            appendiceal orifice, and rectum were photographed. Scope In: 8:39:09 AM Scope Out: 8:54:02 AM Scope Withdrawal Time: 0 hours 11 minutes 19 seconds  Total Procedure Duration: 0 hours 14 minutes 53 seconds  Findings:                 Skin tags were found on perianal exam.                           A few small-mouthed diverticula were found in the                            sigmoid colon.                           The exam was otherwise normal throughout the                            remainder of the colon.                           Non-bleeding internal hemorrhoids were found during  retroflexion. The hemorrhoids were small and Grade                            II (internal hemorrhoids that prolapse but reduce                            spontaneously). Complications:            No immediate complications. Estimated Blood Loss:     Estimated blood loss: none. Impression:               - Perianal skin tags found on perianal exam.                           - Diverticulosis in the sigmoid colon.                           - Non-bleeding internal hemorrhoids.                           - No specimens collected. Recommendation:           - Patient has a contact number available for                            emergencies. The signs and symptoms of potential                            delayed complications were discussed with the                            patient. Return to normal activities tomorrow.                            Written discharge instructions were  provided to the                            patient.                           - Resume previous diet.                           - Continue present medications.                           - Repeat colonoscopy in 10 years for screening                            purposes.                           - Use fiber, for example Citrucel, Fibercon, Konsyl                            or Metamucil. Doristine Locks, MD 10/08/2023 8:59:49 AM

## 2023-10-08 NOTE — Patient Instructions (Signed)
 Please read handouts provided. Continue present medications. Resume previous diet. Repeat colonoscopy in 10 years for screening. Consider a fiber supplement.   YOU HAD AN ENDOSCOPIC PROCEDURE TODAY AT THE [facility]:   Refer to the procedure report that was given to you for any specific questions about what was found during the examination.  If the procedure report does not answer your questions, please call your gastroenterologist to clarify.  If you requested that your care partner not be given the details of your procedure findings, then the procedure report has been included in a sealed envelope for you to review at your convenience later.  YOU SHOULD EXPECT: Some feelings of bloating in the abdomen. Passage of more gas than usual.  Walking can help get rid of the air that was put into your GI tract during the procedure and reduce the bloating. If you had a lower endoscopy (such as a colonoscopy or flexible sigmoidoscopy) you may notice spotting of blood in your stool or on the toilet paper. If you underwent a bowel prep for your procedure, you may not have a normal bowel movement for a few days.  Please Note:  You might notice some irritation and congestion in your nose or some drainage.  This is from the oxygen used during your procedure.  There is no need for concern and it should clear up in a day or so.  SYMPTOMS TO REPORT IMMEDIATELY:  Following lower endoscopy (colonoscopy or flexible sigmoidoscopy):  Excessive amounts of blood in the stool  Significant tenderness or worsening of abdominal pains  Swelling of the abdomen that is new, acute  Fever of 100F or higher.  For urgent or emergent issues, a gastroenterologist can be reached at any hour by calling [phone]. Do not use MyChart messaging for urgent concerns.    DIET:  We do recommend a small meal at first, but then you may proceed to your regular diet.  Drink plenty of fluids but you should avoid alcoholic  beverages for 24 hours.  ACTIVITY:  You should plan to take it easy for the rest of today and you should NOT DRIVE or use heavy machinery until tomorrow (because of the sedation medicines used during the test).    FOLLOW UP: Our staff will call the number listed on your records the next business day following your procedure.  We will call around 7:15- 8:00 am to check on you and address any questions or concerns that you may have regarding the information given to you following your procedure. If we do not reach you, we will leave a message.     If any biopsies were taken you will be contacted by phone or by letter within the next 1-3 weeks.  Please call us at [phone] if you have not heard about the biopsies in 3 weeks.    SIGNATURES/CONFIDENTIALITY: You and/or your care partner have signed paperwork which will be entered into your electronic medical record.  These signatures attest to the fact that that the information above on your After Visit Summary has been reviewed and is understood.  Full responsibility of the confidentiality of this discharge information lies with you and/or your care-partner.

## 2023-10-08 NOTE — Progress Notes (Signed)
 Report to PACU, RN, vss, BBS= Clear.

## 2023-10-08 NOTE — Progress Notes (Signed)
 GASTROENTEROLOGY PROCEDURE H&P NOTE   Primary Care Physician: Hilbert Bible, FNP    Reason for Procedure:  Colon Cancer screening  Plan:    Colonoscopy  Patient is appropriate for endoscopic procedure(s) in the ambulatory ([facility]) setting.  The nature of the procedure, as well as the risks, benefits, and alternatives were carefully and thoroughly reviewed with the patient. Ample time for discussion and questions allowed. The patient understood, was satisfied, and agreed to proceed.     HPI: [name] [name] is a 48 y.o. female who presents for colonoscopy for routine Colon Cancer screening.  No active GI symptoms.  No known family history of colon cancer or related malignancy.  Patient is otherwise without complaints or active issues today.  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 x 5 yr.   PONV (postoperative nausea and vomiting)     Past Surgical History:  Procedure Laterality Date   CESAREAN SECTION     LIPOMA EXCISION Left 05/31/2018   Procedure: EXCISION OF LEFT HIP LIPOMA;  Surgeon: Gaynelle Adu, MD;  Location: [facility];  Service: General;  Laterality: Left;   MICRODISCECTOMY LUMBAR  06/2012   L5   PAROTIDECTOMY Left 05/09/2013   Procedure: LEFT SUPERFICIAL PAROTIDECTOMY;  Surgeon: Flo Shanks, MD;  Location: [facility];  Service: ENT;  Laterality: Left;   PAROTIDECTOMY Left 05/16/2013   Procedure: COMPLETION PAROTIDECTOMY LEFT WITH FACIAL  NERVE PRESERVATION ;  Surgeon: Flo Shanks, MD;  Location: [facility];  Service: ENT;  Laterality: Left;   SPINE SURGERY     TUBAL LIGATION     URETHRAL SLING  2016    Prior to Admission medications   Medication Sig Start Date End Date Taking? Authorizing Provider  buPROPion (WELLBUTRIN XL) 150 MG 24 hr tablet Take 1 tablet by mouth daily. 03/12/23   Tollie Eth, NP  cholecalciferol (VITAMIN D) 1000 UNITS tablet Take 2,000 Units by mouth daily.    [provider]  ibuprofen (ADVIL,MOTRIN) 200 MG tablet Take 600 mg by mouth every 8 (eight) hours as needed (for pain.).    [provider]  lisinopril (ZESTRIL) 20 MG tablet Take 1 tablet (20 mg total) by mouth daily. 02/12/23   Tollie Eth, NP  methylPREDNISolone (MEDROL DOSEPAK) 4 MG TBPK tablet Take per packet instructions 10/05/23   Hazle Nordmann L, PA-C  ondansetron (ZOFRAN) 4 MG tablet Take 1 tablet (4 mg total) by mouth every 8 (eight) hours as needed for nausea or vomiting. 09/22/23   Bernadette Armijo V, DO  ondansetron (ZOFRAN-ODT) 4 MG disintegrating tablet Take 1 tablet (4 mg total) by mouth every 8 (eight) hours as needed for nausea or vomiting. 02/12/23   Early, Sung Amabile, NP  phentermine (ADIPEX-P) 37.5 MG tablet Take 1 tablet (37.5 mg total) by mouth daily before breakfast. 08/06/23   Caudle, Shelton Silvas, FNP  progesterone (PROMETRIUM) 200 MG capsule Take 2 capsules (  400 mg total) by mouth at bedtime for 10 days. 08/20/23 08/30/23      Current Outpatient Medications  Medication Sig Dispense Refill   buPROPion (WELLBUTRIN XL) 150 MG 24 hr tablet Take 1 tablet by mouth daily. 90 tablet 3   cholecalciferol (VITAMIN D) 1000 UNITS tablet Take 2,000 Units by mouth daily.     ibuprofen (ADVIL,MOTRIN) 200 MG tablet Take 600 mg by mouth every 8 (eight) hours as needed (for pain.).     lisinopril (ZESTRIL) 20 MG tablet Take 1 tablet (20 mg total) by mouth daily. 90 tablet 3   methylPREDNISolone (MEDROL DOSEPAK) 4 MG TBPK tablet Take per packet  instructions 21 each 0   ondansetron (ZOFRAN) 4 MG tablet Take 1 tablet (4 mg total) by mouth every 8 (eight) hours as needed for nausea or vomiting. 2 tablet 0   ondansetron (ZOFRAN-ODT) 4 MG disintegrating tablet Take 1 tablet (4 mg total) by mouth every 8 (eight) hours as needed for nausea or vomiting. 20 tablet 5   phentermine (ADIPEX-P) 37.5 MG tablet Take 1 tablet (37.5 mg total) by mouth daily before breakfast. 30 tablet 2   progesterone (PROMETRIUM) 200 MG capsule Take 2 capsules (400 mg total) by mouth at bedtime for 10 days. 20 capsule 0   No current facility-administered medications for this visit.    Allergies as of 10/08/2023 - Review Complete 10/05/2023  Allergen Reaction Noted   Keflex [cephalexin] Rash 05/02/2013   Sulfa antibiotics Rash 05/02/2013    Family History  Problem Relation Age of Onset   Breast cancer Mother        dx early 27s   Uterine cancer Mother        dx mid-late 33s   Diabetes Mother    Pancreatic cancer Mother 31   Prostate cancer Paternal Grandfather        dx late 75s   Breast cancer Other        MGM's sister   Colon cancer Neg Hx    Colon polyps Neg Hx    Esophageal cancer Neg Hx    Stomach cancer Neg Hx    Rectal cancer Neg Hx     Social History   Socioeconomic History   Marital status: Married    Spouse name: Not on file   Number of children: Not on file   Years of education: Not on file   Highest education level: Not on file  Occupational History   Not on file  Tobacco Use   Smoking status: Former    Types: Cigarettes   Smokeless tobacco: Never   Tobacco comments:    quit smoking 2007  Vaping Use   Vaping status: Never Used  Substance and Sexual Activity   Alcohol use: Yes    Comment: rarely   Drug use: No   Sexual activity: Yes    Birth control/protection: Surgical, None    Comment: BTL  Other Topics Concern   Not on file  Social History Narrative   Not on file   Social Drivers of Health   Financial Resource  Strain: Low Risk  (08/06/2023)   Overall Financial Resource Strain (CARDIA)    Difficulty of Paying Living Expenses: Not hard at all  Food Insecurity: No Food Insecurity (08/06/2023)   Hunger Vital Sign    Worried About Running Out of Food in the Last Year: Never true    Ran Out of Food in the Last Year: Never true  Transportation Needs: No Transportation Needs (08/06/2023)  PRAPARE - Administrator, Civil Service (Medical): No    Lack of Transportation (Non-Medical): No  Physical Activity: Sufficiently Active (08/06/2023)   Exercise Vital Sign    Days of Exercise per Week: 4 days    Minutes of Exercise per Session: 60 min  Stress: No Stress Concern Present (08/06/2023)   [employer] of Occupational Health - Occupational Stress Questionnaire    Feeling of Stress : Not at all  Social Connections: Socially Integrated (08/06/2023)   Social Connection and Isolation Panel [NHANES]    Frequency of Communication with Friends and Family: More than three times a week    Frequency of Social Gatherings with Friends and Family: Twice a week    Attends Religious Services: More than 4 times per year    Active Member of [employer] or Organizations: Yes    Attends Engineer, structural: More than 4 times per year    Marital Status: Married  Catering manager Violence: Not At Risk (08/06/2023)   Humiliation, Afraid, Rape, and Kick questionnaire    Fear of Current or Ex-Partner: No    Emotionally Abused: No    Physically Abused: No    Sexually Abused: No    Physical Exam: Vital signs in last 24 hours: @There  were no vitals taken for this visit. GEN: NAD EYE: Sclerae anicteric ENT: MMM CV: Non-tachycardic Pulm: CTA b/l GI: Soft, NT/ND NEURO:  Alert & Oriented x 3   Doristine Locks, DO [facility]   10/08/2023 7:58 AM

## 2023-10-09 ENCOUNTER — Telehealth: Payer: Self-pay | Admitting: *Deleted

## 2023-10-09 NOTE — Telephone Encounter (Signed)
 Post procedure follow up call placed, no answer and left VM.

## 2023-10-15 DIAGNOSIS — M963 Postlaminectomy kyphosis: Secondary | ICD-10-CM | POA: Diagnosis not present

## 2023-10-15 DIAGNOSIS — M47816 Spondylosis without myelopathy or radiculopathy, lumbar region: Secondary | ICD-10-CM | POA: Diagnosis not present

## 2023-10-21 ENCOUNTER — Other Ambulatory Visit (HOSPITAL_BASED_OUTPATIENT_CLINIC_OR_DEPARTMENT_OTHER): Payer: Self-pay | Admitting: Orthopedic Surgery

## 2023-10-21 DIAGNOSIS — M961 Postlaminectomy syndrome, not elsewhere classified: Secondary | ICD-10-CM

## 2023-10-29 ENCOUNTER — Ambulatory Visit (HOSPITAL_BASED_OUTPATIENT_CLINIC_OR_DEPARTMENT_OTHER)
Admission: RE | Admit: 2023-10-29 | Discharge: 2023-10-29 | Disposition: A | Discharge status: Home / Self Care | Source: Ambulatory Visit | Attending: Orthopedic Surgery | Admitting: Orthopedic Surgery

## 2023-10-29 DIAGNOSIS — M48061 Spinal stenosis, lumbar region without neurogenic claudication: Secondary | ICD-10-CM | POA: Diagnosis not present

## 2023-10-29 DIAGNOSIS — M5126 Other intervertebral disc displacement, lumbar region: Secondary | ICD-10-CM | POA: Diagnosis not present

## 2023-10-29 DIAGNOSIS — M47816 Spondylosis without myelopathy or radiculopathy, lumbar region: Secondary | ICD-10-CM | POA: Diagnosis not present

## 2023-10-29 DIAGNOSIS — M4807 Spinal stenosis, lumbosacral region: Secondary | ICD-10-CM | POA: Diagnosis not present

## 2023-10-29 DIAGNOSIS — M961 Postlaminectomy syndrome, not elsewhere classified: Secondary | ICD-10-CM | POA: Diagnosis not present

## 2023-11-13 ENCOUNTER — Other Ambulatory Visit (HOSPITAL_BASED_OUTPATIENT_CLINIC_OR_DEPARTMENT_OTHER): Payer: Self-pay

## 2023-11-13 MED ORDER — PREDNISONE 5 MG PO TABS
ORAL_TABLET | ORAL | 0 refills | Status: DC
  Filled 2023-11-13 – 2023-11-19 (×2): qty 21, 6d supply, fill #0

## 2023-11-19 ENCOUNTER — Other Ambulatory Visit (HOSPITAL_BASED_OUTPATIENT_CLINIC_OR_DEPARTMENT_OTHER): Payer: Self-pay

## 2023-12-20 ENCOUNTER — Other Ambulatory Visit (HOSPITAL_BASED_OUTPATIENT_CLINIC_OR_DEPARTMENT_OTHER): Payer: Self-pay

## 2023-12-21 ENCOUNTER — Other Ambulatory Visit: Payer: Self-pay

## 2023-12-21 ENCOUNTER — Other Ambulatory Visit (HOSPITAL_BASED_OUTPATIENT_CLINIC_OR_DEPARTMENT_OTHER): Payer: Self-pay

## 2023-12-22 ENCOUNTER — Encounter (HOSPITAL_BASED_OUTPATIENT_CLINIC_OR_DEPARTMENT_OTHER): Payer: Self-pay

## 2023-12-22 ENCOUNTER — Other Ambulatory Visit (HOSPITAL_BASED_OUTPATIENT_CLINIC_OR_DEPARTMENT_OTHER): Payer: Self-pay

## 2024-01-05 DIAGNOSIS — M5116 Intervertebral disc disorders with radiculopathy, lumbar region: Secondary | ICD-10-CM | POA: Diagnosis not present

## 2024-01-05 DIAGNOSIS — M963 Postlaminectomy kyphosis: Secondary | ICD-10-CM | POA: Diagnosis not present

## 2024-01-05 DIAGNOSIS — M5431 Sciatica, right side: Secondary | ICD-10-CM | POA: Diagnosis not present

## 2024-01-06 ENCOUNTER — Other Ambulatory Visit (HOSPITAL_BASED_OUTPATIENT_CLINIC_OR_DEPARTMENT_OTHER): Payer: Self-pay

## 2024-01-06 MED ORDER — PREDNISONE 10 MG (21) PO TBPK
ORAL_TABLET | ORAL | 0 refills | Status: DC
  Filled 2024-01-06: qty 21, 6d supply, fill #0

## 2024-01-07 ENCOUNTER — Other Ambulatory Visit (HOSPITAL_BASED_OUTPATIENT_CLINIC_OR_DEPARTMENT_OTHER): Payer: Self-pay

## 2024-01-21 ENCOUNTER — Other Ambulatory Visit (HOSPITAL_BASED_OUTPATIENT_CLINIC_OR_DEPARTMENT_OTHER): Payer: Self-pay

## 2024-01-21 MED ORDER — PREGABALIN 50 MG PO CAPS
50.0000 mg | ORAL_CAPSULE | Freq: Every day | ORAL | 1 refills | Status: DC
  Filled 2024-01-21: qty 60, 30d supply, fill #0
  Filled 2024-04-20: qty 60, 30d supply, fill #1

## 2024-02-03 ENCOUNTER — Ambulatory Visit (INDEPENDENT_AMBULATORY_CARE_PROVIDER_SITE_OTHER): Payer: 59 | Admitting: Family Medicine

## 2024-02-03 ENCOUNTER — Encounter (HOSPITAL_BASED_OUTPATIENT_CLINIC_OR_DEPARTMENT_OTHER): Payer: Self-pay | Admitting: *Deleted

## 2024-02-03 ENCOUNTER — Other Ambulatory Visit (HOSPITAL_BASED_OUTPATIENT_CLINIC_OR_DEPARTMENT_OTHER): Payer: Self-pay

## 2024-02-03 VITALS — BP 123/80 | HR 66 | Ht 66.0 in | Wt 238.0 lb

## 2024-02-03 DIAGNOSIS — I10 Essential (primary) hypertension: Secondary | ICD-10-CM

## 2024-02-03 DIAGNOSIS — R7303 Prediabetes: Secondary | ICD-10-CM

## 2024-02-03 DIAGNOSIS — Z0000 Encounter for general adult medical examination without abnormal findings: Secondary | ICD-10-CM

## 2024-02-03 DIAGNOSIS — F419 Anxiety disorder, unspecified: Secondary | ICD-10-CM

## 2024-02-03 DIAGNOSIS — R112 Nausea with vomiting, unspecified: Secondary | ICD-10-CM | POA: Diagnosis not present

## 2024-02-03 DIAGNOSIS — E785 Hyperlipidemia, unspecified: Secondary | ICD-10-CM

## 2024-02-03 DIAGNOSIS — M5116 Intervertebral disc disorders with radiculopathy, lumbar region: Secondary | ICD-10-CM | POA: Diagnosis not present

## 2024-02-03 MED ORDER — ONDANSETRON 4 MG PO TBDP
4.0000 mg | ORAL_TABLET | Freq: Three times a day (TID) | ORAL | 5 refills | Status: AC | PRN
  Filled 2024-02-03: qty 20, 7d supply, fill #0
  Filled 2024-03-28: qty 20, 7d supply, fill #1
  Filled 2024-07-01: qty 20, 7d supply, fill #2

## 2024-02-03 MED ORDER — BUPROPION HCL 100 MG PO TABS
100.0000 mg | ORAL_TABLET | Freq: Every day | ORAL | 1 refills | Status: DC
  Filled 2024-02-03: qty 60, 60d supply, fill #0
  Filled 2024-04-03: qty 60, 60d supply, fill #1

## 2024-02-03 NOTE — Progress Notes (Signed)
 Subjective:   [name] [name] [birth year]  02/03/2024   CC: Chief Complaint  Patient presents with   Annual Exam    Patient is here today for her physical. Denies any main concerns for today's visit.    HPI: [name] [name] is a 48 y.o. female who presents for a routine health maintenance exam.  Labs collected at time of visit. She is desiring to wean from her wellbutrin  and her lisinopril  given recent 40lbs weight loss with diet and exercise. She is not using her Phentermine  currently.    HEALTH SCREENINGS: - Vision Screening: up to date - Dental Visits: up to date - Pap smear: up to date - Breast Exam: Declined - STD Screening: Declined - Mammogram (40+): Up to date  - Colonoscopy (45+): Up to date  - Bone Density (65+ or under 65 with predisposing conditions): Not applicable  - Lung CA screening with low-dose CT:  Not applicable Adults age 46-80 who are current cigarette smokers or quit within the last 15 years. Must have 20 pack year history.   Depression and Anxiety Screen done today and results listed below:     02/03/2024    8:04 AM 08/06/2023    8:09 AM 02/12/2023    3:10 PM 10/15/2020   12:46 PM  Depression screen PHQ 2/9  Decreased Interest 0 0 0 0  Down, Depressed, Hopeless 0 0 0 0  PHQ - 2 Score 0 0 0 0  Altered sleeping 0 0    Tired, decreased energy 0 0    Change in appetite 0 0    Feeling bad or failure about yourself  0 0    Trouble concentrating 0 0    Moving slowly or fidgety/restless 0 0    Suicidal thoughts 0 0    PHQ-9 Score 0 0    Difficult doing work/chores Not difficult at all Not difficult at all        02/03/2024    8:04 AM 08/06/2023    8:10 AM  GAD 7 : Generalized Anxiety Score  Nervous, Anxious, on Edge 0 0  Control/stop worrying 0 0  Worry too much - different things 0 0  Trouble relaxing 0 0  Restless 0 0  Easily annoyed or irritable 0 0  Afraid - awful might happen 0 0  Total GAD 7 Score 0 0  Anxiety Difficulty Not difficult at all  Not difficult at all    IMMUNIZATIONS: - Tdap: Tetanus vaccination status reviewed: last tetanus booster within 10 years. - HPV: Not applicable - Influenza: Postponed to flu season - Pneumovax: Not applicable - Prevnar 20: Not applicable - Shingrix (50+): Not applicable   Past medical history, surgical history, medications, allergies, family history and social history reviewed with patient today and changes made to appropriate areas of the chart.   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 x 5 yr.   PONV (postoperative nausea and vomiting)  Past Surgical History:  Procedure Laterality Date   CESAREAN SECTION     LIPOMA EXCISION Left 05/31/2018   Procedure: EXCISION OF LEFT HIP LIPOMA;  Surgeon: Tanda Locus, MD;  Location: [facility];  Service: General;  Laterality: Left;   MICRODISCECTOMY LUMBAR  06/2012   L5   PAROTIDECTOMY Left 05/09/2013   Procedure: LEFT SUPERFICIAL PAROTIDECTOMY;  Surgeon: Marlyce Finer, MD;  Location: [facility];  Service: ENT;  Laterality: Left;   PAROTIDECTOMY Left 05/16/2013   Procedure: COMPLETION PAROTIDECTOMY LEFT WITH FACIAL  NERVE PRESERVATION ;  Surgeon: Marlyce Finer, MD;  Location: [facility];  Service: ENT;  Laterality: Left;   SPINE SURGERY     TUBAL LIGATION     URETHRAL SLING  2016    Current Outpatient Medications on File Prior to Visit  Medication Sig   cholecalciferol (VITAMIN D ) 1000 UNITS tablet Take 2,000 Units by mouth daily.    ibuprofen  (ADVIL ,MOTRIN ) 200 MG tablet Take 600 mg by mouth every 8 (eight) hours as needed (for pain.).   lisinopril  (ZESTRIL ) 20 MG tablet Take 1 tablet (20 mg total) by mouth daily.   pregabalin  (LYRICA ) 50 MG capsule Take 1-2 capsules (50-100 mg total) by mouth at bedtime.   No current facility-administered medications on file prior to visit.    Allergies  Allergen Reactions   Keflex [Cephalexin] Rash   Sulfa Antibiotics Rash     Social History   Socioeconomic History   Marital status: Married    Spouse name: Not on file   Number of children: Not on file   Years of education: Not on file   Highest education level: Master's degree (e.g., MA, MS, MEng, MEd, MSW, MBA)  Occupational History   Not on file  Tobacco Use   Smoking status: Former    Current packs/day: 0.00    Types: Cigarettes   Smokeless tobacco: Never   Tobacco comments:    quit smoking 2007  Vaping Use   Vaping status: Never Used  Substance and Sexual Activity   Alcohol use: Yes    Comment: rarely   Drug use: No   Sexual activity: Yes    Birth control/protection: Surgical, None    Comment: BTL  Other Topics Concern   Not on file  Social History Narrative   Not on file   Social Drivers of Health   Financial Resource Strain: Low Risk  (02/03/2024)   Overall Financial Resource Strain (CARDIA)    Difficulty of Paying Living Expenses: Not hard at all  Food Insecurity: No Food Insecurity (02/03/2024)   Hunger Vital Sign    Worried About Running Out of Food in the Last Year: Never true    Ran Out of Food in the Last Year: Never true  Transportation Needs: No Transportation Needs (02/03/2024)   PRAPARE - Administrator, Civil Service (Medical): No    Lack of Transportation (Non-Medical): No  Physical Activity: Insufficiently Active (02/03/2024)   Exercise Vital Sign    Days of Exercise per Week: 4 days    Minutes of Exercise per Session: 30 min  Stress: No Stress Concern Present (02/03/2024)    [employer] of Occupational Health - Occupational Stress Questionnaire    Feeling of Stress: Not at all  Social Connections: Socially Integrated (02/03/2024)   Social Connection and Isolation Panel    Frequency of Communication with Friends and Family: More than three times a week    Frequency of Social Gatherings with  Friends and Family: Once a week    Attends Religious Services: 1 to 4 times per year    Active Member of [employer] or Organizations: Yes    Attends Banker Meetings: 1 to 4 times per year    Marital Status: Married  Catering manager Violence: Not At Risk (08/06/2023)   Humiliation, Afraid, Rape, and Kick questionnaire    Fear of Current or Ex-Partner: No    Emotionally Abused: No    Physically Abused: No    Sexually Abused: No   Social History   Tobacco Use  Smoking Status Former   Current packs/day: 0.00   Types: Cigarettes  Smokeless Tobacco Never  Tobacco Comments   quit smoking 2007   Social History   Substance and Sexual Activity  Alcohol Use Yes   Comment: rarely    Family History  Problem Relation Age of Onset   Breast cancer Mother        dx early 78s   Uterine cancer Mother        dx mid-late 37s   Diabetes Mother    Pancreatic cancer Mother 54   Prostate cancer Paternal Grandfather        dx late 19s   Breast cancer Other        MGM's sister   Colon cancer Neg Hx    Colon polyps Neg Hx    Esophageal cancer Neg Hx    Stomach cancer Neg Hx    Rectal cancer Neg Hx      ROS: Denies fever, fatigue, unexplained weight loss/gain, chest pain, SHOB, and palpitations. Denies neurological deficits, gastrointestinal or genitourinary complaints, and skin changes.   Objective:   Today's Vitals   02/03/24 0800  BP: 123/80  Pulse: 66  SpO2: 100%  Weight: 238 lb (108 kg)  Height: 5' 6 (1.676 m)    GENERAL APPEARANCE: Well-appearing, in NAD. Well nourished.  SKIN: Pink, warm and dry. Turgor normal. No rash, lesion, ulceration, or  ecchymoses. Hair evenly distributed.  HEENT: HEAD: Normocephalic.  EYES: PERRLA. EOMI. Lids intact w/o defect. Sclera white, Conjunctiva pink w/o exudate.  EARS: External ear w/o redness, swelling, masses or lesions. EAC clear. TM's intact, translucent w/o bulging, appropriate landmarks visualized. Appropriate acuity to conversational tones.  NOSE: Septum midline w/o deformity. Nares patent, mucosa pink and non-inflamed w/o drainage. No sinus tenderness.  THROAT: Uvula midline. Oropharynx clear. Tonsils non-inflamed w/o exudate. Oral mucosa pink and moist.  NECK: Supple, Trachea midline. Full ROM w/o pain or tenderness. No lymphadenopathy. Thyroid  non-tender w/o enlargement or palpable masses.  RESPIRATORY: Chest wall symmetrical w/o masses. Respirations even and non-labored. Breath sounds clear to auscultation bilaterally. No wheezes, rales, rhonchi, or crackles. CARDIAC: S1, S2 present, regular rate and rhythm. No gallops, murmurs, rubs, or clicks. PMI w/o lifts, heaves, or thrills. No carotid bruits. Capillary refill <2 seconds. Peripheral pulses 2+ bilaterally. GI: Abdomen soft w/o distention. Normoactive bowel sounds. No palpable masses or tenderness. No guarding or rebound tenderness. Liver and spleen w/o tenderness or enlargement. No CVA tenderness.  MSK: Muscle tone and strength appropriate for age, w/o atrophy or abnormal movement.  EXTREMITIES: Active ROM intact, w/o tenderness, crepitus, or contracture. No obvious joint deformities or effusions. No clubbing, edema, or cyanosis.  NEUROLOGIC: CN's II-XII intact. Motor strength symmetrical with no obvious weakness. No sensory deficits. DTR's 2+ symmetric bilaterally. Steady, even gait.  PSYCH/MENTAL STATUS: Alert, oriented x 3. Cooperative, appropriate mood and affect.    Results for orders placed or performed  in visit on 08/06/23  Lipid panel   Collection Time: 08/06/23  8:39 AM  Result Value Ref Range   Cholesterol, Total 210 (H) 100 -  199 mg/dL   Triglycerides 866 0 - 149 mg/dL   HDL 42 >60 mg/dL   VLDL Cholesterol Cal 24 5 - 40 mg/dL   LDL Chol Calc (NIH) 855 (H) 0 - 99 mg/dL   Chol/HDL Ratio 5.0 (H) 0.0 - 4.4 ratio  Comprehensive metabolic panel   Collection Time: 08/06/23  8:39 AM  Result Value Ref Range   Glucose 97 70 - 99 mg/dL   BUN 14 6 - 24 mg/dL   Creatinine, Ser 9.29 0.57 - 1.00 mg/dL   eGFR 890 >40 fO/fpw/8.26   BUN/Creatinine Ratio 20 9 - 23   Sodium 141 134 - 144 mmol/L   Potassium 4.8 3.5 - 5.2 mmol/L   Chloride 102 96 - 106 mmol/L   CO2 23 20 - 29 mmol/L   Calcium 9.3 8.7 - 10.2 mg/dL   Total Protein 7.0 6.0 - 8.5 g/dL   Albumin 4.3 3.9 - 4.9 g/dL   Globulin, Total 2.7 1.5 - 4.5 g/dL   Bilirubin Total 0.4 0.0 - 1.2 mg/dL   Alkaline Phosphatase 64 44 - 121 IU/L   AST 24 0 - 40 IU/L   ALT 32 0 - 32 IU/L  Hemoglobin A1c   Collection Time: 08/06/23  8:39 AM  Result Value Ref Range   Hgb A1c MFr Bld 5.7 (H) 4.8 - 5.6 %   Est. average glucose Bld gHb Est-mCnc 117 mg/dL  VITAMIN D  25 Hydroxy (Vit-D Deficiency, Fractures)   Collection Time: 08/06/23  8:39 AM  Result Value Ref Range   Vit D, 25-Hydroxy 30.8 30.0 - 100.0 ng/mL    Assessment & Plan:  1. Annual physical exam (Primary) Discussed preventative screenings, vaccines, and healthy lifestyle with patient. Will obtain fasting labs today.   - CBC with Differential/Platelet - Comprehensive metabolic panel with GFR  2. Nausea and vomiting, unspecified vomiting type Controlled currently. Refills of Zofran  requested.  - ondansetron  (ZOFRAN -ODT) 4 MG disintegrating tablet; Take 1 tablet (4 mg total) by mouth every 8 (eight) hours as needed for nausea or vomiting.  Dispense: 20 tablet; Refill: 5  3. Essential hypertension Controlled and improved. Patient will decrease lisinopril  to 10mg  daily and monitor BP regularly.  - Comprehensive metabolic panel with GFR  4. Prediabetes A1C improved 6 months ago and likely improved with recent  intentional weight loss. Will obtain A1C today with labs.  - Hemoglobin A1c  5. Dyslipidemia Improved 6 months prior with diet and exercise. Will check with labs today.  - Lipid panel  6. Anxiety Controlled. Patient desiring to wean from Wellbutrin . Will start Wellbutrin  100mg  daily for 8 weeks and titrate down to Wellbutrin  75mg .    Orders Placed This Encounter  Procedures   CBC with Differential/Platelet   Comprehensive metabolic panel with GFR   Lipid panel   Hemoglobin A1c    PATIENT COUNSELING:  - Encouraged a healthy well-balanced diet. Patient may adjust caloric intake to maintain or achieve ideal body weight. May reduce intake of dietary saturated fat and total fat and have adequate dietary potassium and calcium preferably from fresh fruits, vegetables, and low-fat dairy products.   - Advised to avoid cigarette smoking. - Discussed with the patient that most people either abstain from alcohol or drink within safe limits (<=14/week and <=4 drinks/occasion for males, <=7/weeks and <= 3 drinks/occasion for females) and  that the risk for alcohol disorders and other health effects rises proportionally with the number of drinks per week and how often a drinker exceeds daily limits. - Discussed cessation/primary prevention of drug use and availability of treatment for abuse.  - Discussed sexually transmitted diseases, avoidance of unintended pregnancy and contraceptive alternatives.  - Stressed the importance of regular exercise - Injury prevention: Discussed safety belts, safety helmets, smoke detector, smoking near bedding or upholstery.  - Dental health: Discussed importance of regular tooth brushing, flossing, and dental visits.   NEXT PREVENTATIVE PHYSICAL DUE IN 1 YEAR.  Return in about 1 year (around 02/03/2025) for ANNUAL PHYSICAL.  Patient to reach out to office if new, worrisome, or unresolved symptoms arise or if no improvement in patient's condition. Patient verbalized  understanding and is agreeable to treatment plan. All questions answered to patient's satisfaction.    Thersia Schuyler Stark, [place]

## 2024-02-03 NOTE — Patient Instructions (Signed)
 Wellbutrin  Weaning Schedule:  Wellbutrin  100mg  daily for 8 weeks 2. Wellbutrin  75mg  daily for 4 weeks 3. Wellbutrin  75mg  every other day for 2 weeks    Lisinopril :  Take 1/2 tablet every day and keep an eye on BP. If BP is controlled (less than 130/80), you can try to wean completely from the medication.

## 2024-02-04 ENCOUNTER — Ambulatory Visit (HOSPITAL_BASED_OUTPATIENT_CLINIC_OR_DEPARTMENT_OTHER): Payer: Self-pay | Admitting: Family Medicine

## 2024-02-04 LAB — COMPREHENSIVE METABOLIC PANEL WITH GFR
ALT: 12 IU/L (ref 0–32)
AST: 11 IU/L (ref 0–40)
Albumin: 4.4 g/dL (ref 3.9–4.9)
Alkaline Phosphatase: 49 IU/L (ref 44–121)
BUN/Creatinine Ratio: 20 (ref 9–23)
BUN: 16 mg/dL (ref 6–24)
Bilirubin Total: 0.3 mg/dL (ref 0.0–1.2)
CO2: 23 mmol/L (ref 20–29)
Calcium: 9.7 mg/dL (ref 8.7–10.2)
Chloride: 102 mmol/L (ref 96–106)
Creatinine, Ser: 0.79 mg/dL (ref 0.57–1.00)
Globulin, Total: 2.6 g/dL (ref 1.5–4.5)
Glucose: 92 mg/dL (ref 70–99)
Potassium: 4.3 mmol/L (ref 3.5–5.2)
Sodium: 140 mmol/L (ref 134–144)
Total Protein: 7 g/dL (ref 6.0–8.5)
eGFR: 94 mL/min/1.73 (ref >59)

## 2024-02-04 LAB — LIPID PANEL
Chol/HDL Ratio: 4.5 ratio — ABNORMAL HIGH (ref 0.0–4.4)
Cholesterol, Total: 210 mg/dL — ABNORMAL HIGH (ref 100–199)
HDL: 47 mg/dL (ref >39)
LDL Chol Calc (NIH): 144 mg/dL — ABNORMAL HIGH (ref 0–99)
Triglycerides: 106 mg/dL (ref 0–149)
VLDL Cholesterol Cal: 19 mg/dL (ref 5–40)

## 2024-02-04 LAB — HEMOGLOBIN A1C
Est. average glucose Bld gHb Est-mCnc: 108 mg/dL
Hgb A1c MFr Bld: 5.4 % (ref 4.8–5.6)

## 2024-02-04 LAB — CBC WITH DIFFERENTIAL/PLATELET
Basophils Absolute: 0 x10E3/uL (ref 0.0–0.2)
Basos: 1 %
EOS (ABSOLUTE): 0.1 x10E3/uL (ref 0.0–0.4)
Eos: 2 %
Hematocrit: 38 % (ref 34.0–46.6)
Hemoglobin: 12 g/dL (ref 11.1–15.9)
Immature Grans (Abs): 0 x10E3/uL (ref 0.0–0.1)
Immature Granulocytes: 0 %
Lymphocytes Absolute: 2.9 x10E3/uL (ref 0.7–3.1)
Lymphs: 40 %
MCH: 28.2 pg (ref 26.6–33.0)
MCHC: 31.6 g/dL (ref 31.5–35.7)
MCV: 89 fL (ref 79–97)
Monocytes Absolute: 0.5 x10E3/uL (ref 0.1–0.9)
Monocytes: 6 %
Neutrophils Absolute: 3.7 x10E3/uL (ref 1.4–7.0)
Neutrophils: 51 %
Platelets: 399 x10E3/uL (ref 150–450)
RBC: 4.26 x10E6/uL (ref 3.77–5.28)
RDW: 13.3 % (ref 11.7–15.4)
WBC: 7.2 x10E3/uL (ref 3.4–10.8)

## 2024-02-04 NOTE — Progress Notes (Signed)
 Hi [name] [name], Your CBC is stable.  Your electrolytes, kidney, and liver function look great.  Your A1c has been reduced from 5.7 down to 5.4 and is now out of the prediabetes category.  Congratulations!  Your cholesterol and LDL have actually stayed the same, but your HDL did improve slightly and your triglycerides have lowered which is great.  This has reduced due to your CVD risk.  Please continue on a heart healthy diet and we will continue to monitor this yearly.

## 2024-02-16 ENCOUNTER — Encounter: Payer: 59 | Admitting: Nurse Practitioner

## 2024-03-09 ENCOUNTER — Other Ambulatory Visit (HOSPITAL_BASED_OUTPATIENT_CLINIC_OR_DEPARTMENT_OTHER): Payer: Self-pay

## 2024-03-09 MED ORDER — FLUZONE 0.5 ML IM SUSY
0.5000 mL | PREFILLED_SYRINGE | Freq: Once | INTRAMUSCULAR | 0 refills | Status: AC
Start: 2024-03-09 — End: 2024-03-10
  Filled 2024-03-09: qty 0.5, 1d supply, fill #0

## 2024-03-28 ENCOUNTER — Other Ambulatory Visit: Payer: Self-pay

## 2024-03-28 ENCOUNTER — Other Ambulatory Visit (HOSPITAL_BASED_OUTPATIENT_CLINIC_OR_DEPARTMENT_OTHER): Payer: Self-pay

## 2024-03-28 ENCOUNTER — Other Ambulatory Visit: Payer: Self-pay | Admitting: Nurse Practitioner

## 2024-03-28 DIAGNOSIS — I10 Essential (primary) hypertension: Secondary | ICD-10-CM

## 2024-03-28 MED ORDER — LISINOPRIL 20 MG PO TABS
20.0000 mg | ORAL_TABLET | Freq: Every day | ORAL | 3 refills | Status: AC
  Filled 2024-03-28: qty 90, 90d supply, fill #0
  Filled 2024-06-22: qty 90, 90d supply, fill #1

## 2024-03-31 ENCOUNTER — Other Ambulatory Visit (HOSPITAL_BASED_OUTPATIENT_CLINIC_OR_DEPARTMENT_OTHER): Payer: Self-pay

## 2024-04-08 ENCOUNTER — Other Ambulatory Visit (HOSPITAL_BASED_OUTPATIENT_CLINIC_OR_DEPARTMENT_OTHER): Payer: Self-pay

## 2024-04-08 MED ORDER — PREDNISONE 10 MG (21) PO TBPK
ORAL_TABLET | ORAL | 0 refills | Status: AC
  Filled 2024-04-08: qty 21, 6d supply, fill #0

## 2024-04-21 ENCOUNTER — Other Ambulatory Visit: Payer: Self-pay

## 2024-04-21 DIAGNOSIS — M5116 Intervertebral disc disorders with radiculopathy, lumbar region: Secondary | ICD-10-CM | POA: Diagnosis not present

## 2024-04-21 DIAGNOSIS — M5431 Sciatica, right side: Secondary | ICD-10-CM | POA: Diagnosis not present

## 2024-04-26 DIAGNOSIS — M5116 Intervertebral disc disorders with radiculopathy, lumbar region: Secondary | ICD-10-CM | POA: Diagnosis not present

## 2024-04-26 DIAGNOSIS — M5431 Sciatica, right side: Secondary | ICD-10-CM | POA: Diagnosis not present

## 2024-05-02 ENCOUNTER — Encounter: Payer: Self-pay | Admitting: Radiology

## 2024-05-03 DIAGNOSIS — M5116 Intervertebral disc disorders with radiculopathy, lumbar region: Secondary | ICD-10-CM | POA: Diagnosis not present

## 2024-05-10 DIAGNOSIS — M5116 Intervertebral disc disorders with radiculopathy, lumbar region: Secondary | ICD-10-CM | POA: Diagnosis not present

## 2024-05-12 ENCOUNTER — Other Ambulatory Visit (HOSPITAL_BASED_OUTPATIENT_CLINIC_OR_DEPARTMENT_OTHER): Payer: Self-pay

## 2024-05-12 MED ORDER — PREGABALIN 50 MG PO CAPS
50.0000 mg | ORAL_CAPSULE | Freq: Every day | ORAL | 1 refills | Status: DC
  Filled 2024-05-30: qty 60, 30d supply, fill #0
  Filled 2024-06-28 – 2024-07-01 (×2): qty 60, 30d supply, fill #1

## 2024-05-17 DIAGNOSIS — M5116 Intervertebral disc disorders with radiculopathy, lumbar region: Secondary | ICD-10-CM | POA: Diagnosis not present

## 2024-05-23 DIAGNOSIS — M5116 Intervertebral disc disorders with radiculopathy, lumbar region: Secondary | ICD-10-CM | POA: Diagnosis not present

## 2024-05-24 ENCOUNTER — Other Ambulatory Visit (HOSPITAL_BASED_OUTPATIENT_CLINIC_OR_DEPARTMENT_OTHER): Payer: Self-pay

## 2024-05-24 MED ORDER — PREDNISONE 5 MG (21) PO TBPK
5.0000 mg | ORAL_TABLET | ORAL | 0 refills | Status: DC
  Filled 2024-05-24 (×2): qty 21, 6d supply, fill #0

## 2024-05-30 ENCOUNTER — Other Ambulatory Visit (HOSPITAL_BASED_OUTPATIENT_CLINIC_OR_DEPARTMENT_OTHER): Payer: Self-pay | Admitting: Family Medicine

## 2024-05-31 ENCOUNTER — Other Ambulatory Visit: Payer: Self-pay

## 2024-05-31 ENCOUNTER — Other Ambulatory Visit (HOSPITAL_BASED_OUTPATIENT_CLINIC_OR_DEPARTMENT_OTHER): Payer: Self-pay | Admitting: Family Medicine

## 2024-05-31 ENCOUNTER — Encounter (HOSPITAL_BASED_OUTPATIENT_CLINIC_OR_DEPARTMENT_OTHER): Payer: Self-pay | Admitting: Family Medicine

## 2024-05-31 ENCOUNTER — Other Ambulatory Visit (HOSPITAL_BASED_OUTPATIENT_CLINIC_OR_DEPARTMENT_OTHER): Payer: Self-pay

## 2024-05-31 DIAGNOSIS — C07 Malignant neoplasm of parotid gland: Secondary | ICD-10-CM | POA: Diagnosis not present

## 2024-05-31 MED ORDER — BUPROPION HCL 100 MG PO TABS
100.0000 mg | ORAL_TABLET | Freq: Every day | ORAL | 1 refills | Status: DC
  Filled 2024-05-31: qty 60, 60d supply, fill #0

## 2024-05-31 MED ORDER — BUPROPION HCL 100 MG PO TABS
100.0000 mg | ORAL_TABLET | Freq: Every day | ORAL | 3 refills | Status: AC
  Filled 2024-07-29: qty 90, 90d supply, fill #0

## 2024-06-01 ENCOUNTER — Other Ambulatory Visit (HOSPITAL_BASED_OUTPATIENT_CLINIC_OR_DEPARTMENT_OTHER): Payer: Self-pay

## 2024-06-15 DIAGNOSIS — H5213 Myopia, bilateral: Secondary | ICD-10-CM | POA: Diagnosis not present

## 2024-06-15 DIAGNOSIS — H524 Presbyopia: Secondary | ICD-10-CM | POA: Diagnosis not present

## 2024-06-20 ENCOUNTER — Other Ambulatory Visit (HOSPITAL_BASED_OUTPATIENT_CLINIC_OR_DEPARTMENT_OTHER): Payer: Self-pay | Admitting: Family Medicine

## 2024-06-20 DIAGNOSIS — Z1231 Encounter for screening mammogram for malignant neoplasm of breast: Secondary | ICD-10-CM

## 2024-06-24 ENCOUNTER — Other Ambulatory Visit (HOSPITAL_COMMUNITY): Payer: Self-pay

## 2024-06-28 ENCOUNTER — Other Ambulatory Visit (HOSPITAL_BASED_OUTPATIENT_CLINIC_OR_DEPARTMENT_OTHER): Payer: Self-pay

## 2024-06-29 ENCOUNTER — Other Ambulatory Visit (HOSPITAL_BASED_OUTPATIENT_CLINIC_OR_DEPARTMENT_OTHER): Payer: Self-pay

## 2024-06-29 MED ORDER — METHOCARBAMOL 500 MG PO TABS
500.0000 mg | ORAL_TABLET | Freq: Three times a day (TID) | ORAL | 0 refills | Status: AC | PRN
  Filled 2024-06-29: qty 90, 30d supply, fill #0

## 2024-06-29 MED ORDER — HYDROCODONE-ACETAMINOPHEN 5-325 MG PO TABS
1.0000 | ORAL_TABLET | ORAL | 0 refills | Status: AC | PRN
  Filled 2024-06-29: qty 42, 7d supply, fill #0

## 2024-07-01 ENCOUNTER — Other Ambulatory Visit: Payer: Self-pay

## 2024-07-01 ENCOUNTER — Other Ambulatory Visit (HOSPITAL_BASED_OUTPATIENT_CLINIC_OR_DEPARTMENT_OTHER): Payer: Self-pay

## 2024-07-05 ENCOUNTER — Other Ambulatory Visit (HOSPITAL_BASED_OUTPATIENT_CLINIC_OR_DEPARTMENT_OTHER): Payer: Self-pay

## 2024-07-05 MED ORDER — CYCLOBENZAPRINE HCL 10 MG PO TABS
10.0000 mg | ORAL_TABLET | Freq: Three times a day (TID) | ORAL | 0 refills | Status: AC
  Filled 2024-07-05: qty 90, 30d supply, fill #0

## 2024-07-05 MED ORDER — OXYCODONE-ACETAMINOPHEN 5-325 MG PO TABS
1.0000 | ORAL_TABLET | ORAL | 0 refills | Status: AC | PRN
  Filled 2024-07-05: qty 42, 7d supply, fill #0

## 2024-07-23 ENCOUNTER — Other Ambulatory Visit: Payer: Self-pay | Admitting: Medical Genetics

## 2024-07-26 ENCOUNTER — Other Ambulatory Visit (HOSPITAL_BASED_OUTPATIENT_CLINIC_OR_DEPARTMENT_OTHER): Payer: Self-pay

## 2024-07-26 MED ORDER — PREDNISONE 5 MG (21) PO TBPK
ORAL_TABLET | ORAL | 0 refills | Status: AC
  Filled 2024-07-26: qty 21, 6d supply, fill #0

## 2024-07-27 ENCOUNTER — Other Ambulatory Visit (HOSPITAL_BASED_OUTPATIENT_CLINIC_OR_DEPARTMENT_OTHER): Payer: Self-pay

## 2024-07-27 MED ORDER — PREGABALIN 50 MG PO CAPS
50.0000 mg | ORAL_CAPSULE | Freq: Every day | ORAL | 1 refills | Status: AC
  Filled 2024-07-29 – 2024-08-01 (×2): qty 60, 30d supply, fill #0

## 2024-07-29 ENCOUNTER — Other Ambulatory Visit (HOSPITAL_BASED_OUTPATIENT_CLINIC_OR_DEPARTMENT_OTHER): Payer: Self-pay

## 2024-07-29 ENCOUNTER — Encounter (HOSPITAL_BASED_OUTPATIENT_CLINIC_OR_DEPARTMENT_OTHER): Payer: Self-pay | Admitting: Radiology

## 2024-07-29 ENCOUNTER — Other Ambulatory Visit: Payer: Self-pay

## 2024-07-29 ENCOUNTER — Ambulatory Visit (HOSPITAL_BASED_OUTPATIENT_CLINIC_OR_DEPARTMENT_OTHER)
Admission: RE | Admit: 2024-07-29 | Discharge: 2024-07-29 | Disposition: A | Source: Ambulatory Visit | Attending: Family Medicine | Admitting: Family Medicine

## 2024-07-29 DIAGNOSIS — Z1231 Encounter for screening mammogram for malignant neoplasm of breast: Secondary | ICD-10-CM | POA: Diagnosis present

## 2024-08-01 ENCOUNTER — Encounter (HOSPITAL_BASED_OUTPATIENT_CLINIC_OR_DEPARTMENT_OTHER): Payer: Self-pay

## 2024-08-01 ENCOUNTER — Other Ambulatory Visit (HOSPITAL_BASED_OUTPATIENT_CLINIC_OR_DEPARTMENT_OTHER): Payer: Self-pay

## 2024-08-03 ENCOUNTER — Ambulatory Visit (HOSPITAL_BASED_OUTPATIENT_CLINIC_OR_DEPARTMENT_OTHER): Payer: Self-pay | Admitting: Family Medicine

## 2024-08-03 NOTE — Progress Notes (Signed)
 [name] [name],   Your mammogram results show no evidence of breast cancer. We will plan to repeat this in 1 year for routine screening.  If you should have concerns or changes in your breasts within the next year, please let us  know.   Thersia Stark, FNP-C

## 2024-08-12 ENCOUNTER — Other Ambulatory Visit (HOSPITAL_COMMUNITY)

## 2025-02-03 ENCOUNTER — Encounter (HOSPITAL_BASED_OUTPATIENT_CLINIC_OR_DEPARTMENT_OTHER): Admitting: Family Medicine
